# Patient Record
Sex: Male | Born: 1985 | Race: White | Hispanic: No | Marital: Single | Smoking: Current every day smoker
Health system: Southern US, Community
[De-identification: ages and names within clinical notes are randomized; demographics above are authoritative.]

## PROBLEM LIST (undated history)

## (undated) DIAGNOSIS — S62309A Unspecified fracture of unspecified metacarpal bone, initial encounter for closed fracture: Secondary | ICD-10-CM

## (undated) DIAGNOSIS — S20419A Abrasion of unspecified back wall of thorax, initial encounter: Secondary | ICD-10-CM

## (undated) DIAGNOSIS — Z8614 Personal history of Methicillin resistant Staphylococcus aureus infection: Secondary | ICD-10-CM

## (undated) HISTORY — PX: ORIF ORBITAL FRACTURE: SHX5312

---

## 2013-09-17 ENCOUNTER — Encounter (HOSPITAL_COMMUNITY): Payer: Self-pay | Admitting: Emergency Medicine

## 2013-09-17 ENCOUNTER — Emergency Department (HOSPITAL_COMMUNITY)
Admission: EM | Admit: 2013-09-17 | Discharge: 2013-09-17 | Disposition: A | Discharge status: Home / Self Care | Payer: 59 | Attending: Emergency Medicine | Admitting: Emergency Medicine

## 2013-09-17 DIAGNOSIS — K029 Dental caries, unspecified: Secondary | ICD-10-CM

## 2013-09-17 DIAGNOSIS — K006 Disturbances in tooth eruption: Secondary | ICD-10-CM | POA: Insufficient documentation

## 2013-09-17 DIAGNOSIS — F17200 Nicotine dependence, unspecified, uncomplicated: Secondary | ICD-10-CM | POA: Insufficient documentation

## 2013-09-17 MED ORDER — PENICILLIN V POTASSIUM 500 MG PO TABS: 500.0000 mg | ORAL_TABLET | Freq: Three times a day (TID) | ORAL | Status: DC

## 2013-09-17 MED ORDER — OXYCODONE-ACETAMINOPHEN 5-325 MG PO TABS
2.0000 | ORAL_TABLET | Freq: Once | ORAL | Status: AC
  Administered 2013-09-17: 2 via ORAL
  Filled 2013-09-17: qty 2

## 2013-09-17 MED ORDER — OXYCODONE-ACETAMINOPHEN 5-325 MG PO TABS: 1.0000 | ORAL_TABLET | Freq: Four times a day (QID) | ORAL | Status: DC | PRN

## 2013-09-17 NOTE — ED Provider Notes (Signed)
CSN: [id number]     Arrival date & time 09/17/13  2132 History  This chart was scribed for a non-physician practitioner, Dierdre Forth, PA-C, working with Karlene Lineman, MD found by Julian Hy, ED Scribe. The patient was seen in TR10C/[facility]10C. The patient's care was started at 9:58 PM.  Chief Complaint  Patient presents with  . Dental Pain   Patient is a 28 y.o. male presenting with tooth pain. The history is provided by the patient and medical records. No language interpreter was used.  Dental Pain Location:  Upper Quality:  Throbbing Severity:  Moderate Duration:  2 weeks Timing:  Constant Progression:  Worsening Worsened by:  Hot food/drink and cold food/drink Associated symptoms: no drooling, no facial swelling, no fever, no headaches and no neck pain    HPI Comments: Rayaan Garguilo is a 28 y.o. male who presents to the Emergency Department complaining of right and left, upper molar tooth pain onset 2 weeks ago. Pt states his pain is worsened with exposure to hot and cold liquids. Pt describes his pain as throbbing. Pt states he has a dentist appointment in 2 weeks, but his pain is too severe to wait. Pt denies having chills, fever, nausea or vomiting.  History reviewed. No pertinent past medical history. Past Surgical History  Procedure Laterality Date  . Fracture surgery     No family history on file. History  Substance Use Topics  . Smoking status: Current Every Day Smoker  . Smokeless tobacco: Not on file  . Alcohol Use: Yes     Comment: seldom    Review of Systems  Constitutional: Negative for fever, chills and appetite change.  HENT: Positive for dental problem (right upper and left upper ). Negative for drooling, ear pain, facial swelling, nosebleeds, postnasal drip, rhinorrhea and trouble swallowing.   Eyes: Negative for pain and redness.  Respiratory: Negative for cough and wheezing.   Cardiovascular: Negative for chest pain.  Gastrointestinal: Negative  for nausea, vomiting and abdominal pain.  Musculoskeletal: Negative for neck pain and neck stiffness.  Skin: Negative for color change and rash.  Neurological: Negative for weakness, light-headedness and headaches.  All other systems reviewed and are negative.     Allergies  Review of patient's allergies indicates no known allergies.  Home Medications   Prior to Admission medications   Medication Sig Start Date End Date Taking? Authorizing Provider  ibuprofen (ADVIL,MOTRIN) 200 MG tablet Take 200 mg by mouth every 6 (six) hours as needed for moderate pain. For tooth pain   Yes Historical Provider, MD  oxyCODONE-acetaminophen (PERCOCET) 5-325 MG per tablet Take 1-2 tablets by mouth every 6 (six) hours as needed. 09/17/13   Romeo Zielinski, PA-C  penicillin v potassium (VEETID) 500 MG tablet Take 1 tablet (500 mg total) by mouth 3 (three) times daily. 09/17/13   Simra Fiebig, PA-C   Triage Vitals: BP 145/86  Pulse 98  Temp(Src) 98.7 F (37.1 C) (Oral)  Resp 16  Ht 5\' 11"  (1.803 m)  Wt 145 lb (65.772 kg)  BMI 20.23 kg/m2  SpO2 100% Physical Exam  Nursing note and vitals reviewed. Constitutional: He appears well-developed and well-nourished.  HENT:  Head: Normocephalic.  Right Ear: Tympanic membrane, external ear and ear canal normal.  Left Ear: Tympanic membrane, external ear and ear canal normal.  Nose: Nose normal. Right sinus exhibits no maxillary sinus tenderness and no frontal sinus tenderness. Left sinus exhibits no maxillary sinus tenderness and no frontal sinus tenderness.  Mouth/Throat: Uvula is  midline, oropharynx is clear and moist and mucous membranes are normal. No oral lesions. Abnormal dentition. Dental caries present. No uvula swelling or lacerations. No oropharyngeal exudate, posterior oropharyngeal edema, posterior oropharyngeal erythema or tonsillar abscesses.    Dental caries to the left upper and right abnormal areas; no broken teeth  No erythema  or swelling to the gingiva; no gross abscess  Eyes: Conjunctivae are normal. Pupils are equal, round, and reactive to light. Right eye exhibits no discharge. Left eye exhibits no discharge.  Neck: Normal range of motion. Neck supple.  Cardiovascular: Normal rate, regular rhythm and normal heart sounds.   Pulmonary/Chest: Effort normal and breath sounds normal. No respiratory distress. He has no wheezes.  Abdominal: Soft. Bowel sounds are normal. He exhibits no distension. There is no tenderness.  Lymphadenopathy:    He has no cervical adenopathy.  Neurological: He is alert.  Skin: Skin is warm and dry.  Psychiatric: He has a normal mood and affect.    ED Course  Procedures (including critical care time) DIAGNOSTIC STUDIES: Oxygen Saturation is 100% on RA, normal by my interpretation.    COORDINATION OF CARE: 10:11 PM- Patient informed of current plan for treatment and evaluation and agrees with plan at this time.  Labs Review Labs Reviewed - No data to display  Imaging Review No results found.   EKG Interpretation None      MDM   Final diagnoses:  Pain due to dental caries   Natasha BenceShannon Lipps presents with toothache.  No gross abscess.  Exam unconcerning for Ludwig's angina or spread of infection.  Will treat with penicillin and pain medicine.  Urged patient to follow-up with dentist.    I have personally reviewed patient's vitals, nursing note and any pertinent labs or imaging. At this time, it has been determined that no acute conditions requiring further emergency intervention. The patient/guardian have been advised of the diagnosis and plan. I reviewed all labs and imaging including any potential incidental findings. We have discussed signs and symptoms that warrant return to the ED, such as fever, difficulty swallowing, swelling of the face.  Patient/guardian has voiced understanding and agreed to follow-up with the PCP or specialist as soon as possible.  Vital signs are  stable at discharge.   BP 145/86  Pulse 98  Temp(Src) 98.7 F (37.1 C) (Oral)  Resp 16  Ht 5\' 11"  (1.803 m)  Wt 145 lb (65.772 kg)  BMI 20.23 kg/m2  SpO2 100%    I personally performed the services described in this documentation, which was scribed in my presence. The recorded information has been reviewed and is accurate.      Dahlia ClientHannah Emogene Muratalla, PA-C 09/17/13 2211

## 2013-09-17 NOTE — ED Notes (Signed)
Pt c/o dental pain st's having pain in right upper back tooth and left upper back tooth.  St's onset of pain 2 weeks ago.  Pt also st's he has appt with a dentist in 2 weeks but can't wait until then.

## 2013-09-17 NOTE — Discharge Instructions (Signed)
1. Medications: percocet, penicillin, usual home medications 2. Treatment: rest, drink plenty of fluids, take medications as prescribed 3. Follow Up: Please followup with your primary doctor for discussion of your diagnoses and further evaluation after today's visit; if you do not have a primary care doctor use the resource guide provided to find one; f/u with dentistry as discussed    Dental Caries  Dental caries (also called tooth decay) is the most common oral disease. It can occur at any age, but is more common in children and young adults.  HOW DENTAL CARIES DEVELOPS  The process of decay begins when bacteria and foods (particularly sugars and starches) combine in your mouth to produce plaque. Plaque is a substance that sticks to the hard, outer surface of a tooth (enamel). The bacteria in plaque produce acids that attack enamel. These acids may also attack the root surface of a tooth (cementum) if it is exposed. Repeated attacks dissolve these surfaces and create holes in the tooth (cavities). If left untreated, the acids destroy the other layers of the tooth.  RISK FACTORS  Frequent sipping of sugary beverages.   Frequent snacking on sugary and starchy foods, especially those that easily get stuck in the teeth.   Poor oral hygiene.   Dry mouth.   Substance abuse such as methamphetamine abuse.   Broken or poor-fitting dental restorations.   Eating disorders.   Gastroesophageal reflux disease (GERD).   Certain radiation treatments to the head and neck. SYMPTOMS In the early stages of dental caries, symptoms are seldom present. Sometimes white, chalky areas may be seen on the enamel or other tooth layers. In later stages, symptoms may include:  Pits and holes on the enamel.  Toothache after sweet, hot, or cold foods or drinks are consumed.  Pain around the tooth.  Swelling around the tooth. DIAGNOSIS  Most of the time, dental caries is detected during a regular  dental checkup. A diagnosis is made after a thorough medical and dental history is taken and the surfaces of your teeth are checked for signs of dental caries. Sometimes special instruments, such as lasers, are used to check for dental caries. Dental X-ray exams may be taken so that areas not visible to the eye (such as between the contact areas of the teeth) can be checked for cavities.  TREATMENT  If dental caries is in its early stages, it may be reversed with a fluoride treatment or an application of a remineralizing agent at the dental office. Thorough brushing and flossing at home is needed to aid these treatments. If it is in its later stages, treatment depends on the location and extent of tooth destruction:   If a small area of the tooth has been destroyed, the destroyed area will be removed and cavities will be filled with a material such as gold, silver amalgam, or composite resin.   If a large area of the tooth has been destroyed, the destroyed area will be removed and a cap (crown) will be fitted over the remaining tooth structure.   If the center part of the tooth (pulp) is affected, a procedure called a root canal will be needed before a filling or crown can be placed.   If most of the tooth has been destroyed, the tooth may need to be pulled (extracted). HOME CARE INSTRUCTIONS You can prevent, stop, or reverse dental caries at home by practicing good oral hygiene. Good oral hygiene includes:  Thoroughly cleaning your teeth at least twice a day  with a toothbrush and dental floss.   Using a fluoride toothpaste. A fluoride mouth rinse may also be used if recommended by your dentist or health care provider.   Restricting the amount of sugary and starchy foods and sugary liquids you consume.   Avoiding frequent snacking on these foods and sipping of these liquids.   Keeping regular visits with a dentist for checkups and cleanings. PREVENTION   Practice good oral  hygiene.  Consider a dental sealant. A dental sealant is a coating material that is applied by your dentist to the pits and grooves of teeth. The sealant prevents food from being trapped in them. It may protect the teeth for several years.  Ask about fluoride supplements if you live in a community without fluorinated water or with water that has a low fluoride content. Use fluoride supplements as directed by your dentist or health care provider.  Allow fluoride varnish applications to teeth if directed by your dentist or health care provider. Document Released: 11/10/2001 Document Revised: 10/21/2012 Document Reviewed: 02/21/2012 [facility] Patient Information 2015 [place]. This information is not intended to replace advice given to you by your health care provider. Make sure you discuss any questions you have with your health care provider.    Emergency Department Resource Guide 1) Find a Doctor and Pay Out of Pocket Although you won't have to find out who is covered by your insurance plan, it is a good idea to ask around and get recommendations. You will then need to call the office and see if the doctor you have chosen will accept you as a new patient and what types of options they offer for patients who are self-pay. Some doctors offer discounts or will set up payment plans for their patients who do not have insurance, but you will need to ask so you aren't surprised when you get to your appointment.  2) Contact Your Local Health Department Not all health departments have doctors that can see patients for sick visits, but many do, so it is worth a call to see if yours does. If you don't know where your local health department is, you can check in your phone book. The CDC also has a tool to help you locate your state's health department, and many state websites also have listings of all of their local health departments.  3) Find a Walk-in Clinic If your illness is not likely to be very  severe or complicated, you may want to try a walk in clinic. These are popping up all over the country in pharmacies, drugstores, and shopping centers. They're usually staffed by nurse practitioners or physician assistants that have been trained to treat common illnesses and complaints. They're usually fairly quick and inexpensive. However, if you have serious medical issues or chronic medical problems, these are probably not your best option.  No Primary Care Doctor: - Call Health Connect at  [phone] - they can help you locate a primary care doctor that  accepts your insurance, provides certain services, etc. - Physician Referral Service- [phone]  Chronic Pain Problems: Organization         Address  Phone   Notes  [facility]  [phone] Patients need to be referred by their primary care doctor.   Medication Assistance: Organization         Address  Phone   Notes  [facility] [address] [place], [place] [phone] --Must be  a resident of [facility] -- Must have NO insurance coverage whatsoever (no Medicaid/ Medicare, etc.) -- The pt. MUST have a primary care doctor that directs their care regularly and follows them in the community   [facility]  [phone]   [employer]  [phone]    Agencies that provide inexpensive medical care: Organization         Address  Phone   Notes  [facility]  [phone]   [facility]    [phone]   [facility] [address] [place], [place] [address] [phone]   [facility] [address] [place] [place] [phone]   Planned Parenthood    [phone]   [facility]    [phone]   [facility]  [address], [place] Phone:  [phone], Fax:  [phone] Hours of Operation:  9 am - 6 pm, M-F.  Also accepts  Medicaid/Medicare and self-pay.  [facility]  [address], [place] Phone: [phone], Fax: [phone]. Hours of Operation:  8:30 am - 5:30 pm, M-F.  Also accepts Medicaid and self-pay.  [facility] [place] [address] [place] Phone: [phone]   [facility] [address] Natasha Bence [place], [place] [phone], Ext. [phone] Mondays & Thursdays: 7-9 AM.  First 15 patients are seen on a first come, first serve basis.    Medicaid-accepting [facility] Providers:  Organization         Address  Phone   Notes  [facility] [address], [place] [phone] Also accepts self-pay patients.  [facility] [address] Laurell Josephs [place], [place]  [phone]   [facility] [address], [place] [phone]   [facility] [address] [place] [phone]   Renaye Rakers [address], [place]   [phone] Only accepts [place] patients after they have their name applied to their card.   Self-Pay (no insurance) in [facility]:  Organization         Address  Phone   Notes  Sickle Cell Patients, [facility] [address] [place], [place] [phone]   [facility] [address] [place], [place] [phone]   [facility]  [address], [place] [phone]   [facility]/Dr. Osei-Bonsu  [address], [place] or [address], [place] [phone] Phone number for both [place] and [place] locations is the same.  [facility] [address], [place] [phone]   [facility] [address], [place] or [address] [phone]   [facility] [address] [place] [phone], phone; [phone], fax Sees patients 1st and 3rd Saturday of every month.  Must not qualify for public or private insurance (i.e. Medicaid, Medicare, [place] Health Choice, Veterans' Benefits)  Household income should be no more than 200% of the poverty level The clinic cannot treat you if you are pregnant or think you are pregnant  Sexually transmitted diseases are not treated at the clinic.    Dental Care: Organization  Address  Phone  Notes  °[facility] [address], [place] [phone] Accepts children up to age 21 who are enrolled in Medicaid or [place] Health Choice; pregnant women with a Medicaid card; and children who have applied for Medicaid or [place] Health Choice, but were declined, whose parents can pay a reduced fee at time of service.  °[facility] [place]  [address], [place] [phone] Accepts children up to age 21 who are enrolled in Medicaid or [place] Health Choice; pregnant women with a Medicaid card; and children who have applied for Medicaid or [place] Health Choice, but were declined, whose parents can pay a reduced fee at time of service.  °[facility] ° [address], [place] [phone] Patients are seen by appointment only. Walk-ins are not accepted. [facility] will see patients 18 years of age and older. °Monday - Tuesday (8am-5pm) °Most Wednesdays (8:30-5pm) °$30 per visit, cash only  °[facility] ° [address], [place] [phone] Patients are seen by appointment only. Walk-ins are not accepted. [facility] will see patients 18 years of age and older. °One Wednesday Evening (Monthly: Volunteer Based).  $30 per visit, cash only  °[facility]  [phone] for adults; Children under age 4, call Graduate Pediatric Dentistry at [phone]. Children aged  4-14, please call [phone] to request a pediatric application. ° Dental services are provided in all areas of dental care including fillings, crowns and bridges, complete and partial dentures, implants, gum treatment, root canals, and extractions. Preventive care is also provided. Treatment is provided to both adults and children. °Patients are selected via a lottery and there is often a waiting list. °  °[facility] [address], °[place] ° ([phone] [email or web address] °  °[facility] [address], [place], [place] [phone], Ext. [phone] Second and Fourth Thursday of each month, opens at 6:30 AM; Clinic ends at 9 AM.  Patients are seen on a first-come first-served basis, and a limited number are seen during each clinic.  ° °[facility] ° [address], [place], [place] [phone]   Eligibility Requirements °You must have lived in [place], [place], or [place] counties for at least the last three months. °  You cannot be eligible for state or federal sponsored healthcare insurance, including Veterans Administration, Medicaid, or Medicare. °  You generally cannot be eligible for healthcare insurance through your employer.  °  How to apply: °Eligibility screenings are held every Tuesday and Wednesday afternoon from 1:00 pm until 4:00 pm. You do not need an appointment for the interview!  °[facility] [address], [place], [place] [phone]   °[facility]  [phone]   °[facility]  [phone]   °[facility]  [phone]   ° °Behavioral Health Resources in the Community: °Intensive Outpatient Programs °Organization         Address  Phone  Notes  °[facility] [address], [place], [place] [phone]   °[facility] [address], [place], [place] [phone]   °[facility] [address],  [place], [place] ° [phone]   °[facility] [address],  °[place], [place] 1-800-853-5163 or [phone]   °Substance Abuse Resources °  Organization         Address  Phone  Notes  [facility]  [phone]   [facility]  [phone]   [facility]  [phone]   Floydene Flock  [phone]   Residential & Outpatient Substance Abuse Program  [phone]   Psychological Services Organization         Address  Phone  Notes  [facility]  [phone]   [facility]  [phone]   [facility] [address] [place] [phone] or [phone]    Mobile Crisis Teams Organization         Address  Phone  Notes  [facility], Mobile Crisis Care Unit  ([phone]   [facility]  [address] [place], [place] [phone]   Doristine Locks [address] [place] [phone]    Self-Help/Support Groups Organization         Address  Phone             Notes  [facility] [place] - variety of support groups  336- I7437963 Call for more information  Narcotics Anonymous (NA), [facility] [address], [employer] [place]  2 meetings at this location   Statistician         Address  Phone  Notes  [facility] [address],    [place]  [phone]   [facility]  [address], [place] [address], [place], [place] [phone]   [facility] [address] [place], [place] [phone] Admissions: 8am-3pm M-F  [facility] [address],    [place], [place] [phone]   [facility] [address] [place], [place], [place] [phone]   [facility] [address],  [place], [place] [phone]   [facility] - Intensive Outpatient [address] Laurell Josephs [address], [place], [place] [phone]   [facility]  ([facility] [address] [place].,  [place], [place] [phone] or [phone]   [facility] ([facility]) [address], [place], [place] [phone] Accepts Medicaid  [facility] [place] [address]  [place] [phone] Substance Abuse/Addiction Treatment   [facility]         Address  Phone  Notes  [facility]  [phone]   Angie Fava, PhD [address] Ervin Knack [place], [place]   (279) [phone] or [phone]   [facility]   [address] [place], [place] [phone]   [facility] [address], [place], [place] [phone] Insurance/Medicaid/sponsorship through [facility] [address]                                    [place], [place] [phone] Therapy/tele-psych/case  [facility] [address], [place] [phone]    Dr. Lolly Mustache  [phone]   [facility]  [employer] [facility] Dept. 1) [address] [place] 2) [address], [place] 3)  [address], [place] [phone]  [phone]   [facility] Child Abuse Hotline (863) 365-9947 or [phone] (After Hours)

## 2013-09-18 NOTE — ED Provider Notes (Signed)
Medical screening examination/treatment/procedure(s) were performed by non-physician practitioner and as supervising physician I was immediately available for consultation/collaboration.  Shweta Aman L Dicky Boer, MD 09/18/13 0119 

## 2013-09-28 ENCOUNTER — Encounter (HOSPITAL_COMMUNITY): Payer: Self-pay | Admitting: Emergency Medicine

## 2013-09-28 ENCOUNTER — Emergency Department (HOSPITAL_COMMUNITY)
Admission: EM | Admit: 2013-09-28 | Discharge: 2013-09-28 | Disposition: A | Discharge status: Home / Self Care | Payer: 59 | Attending: Emergency Medicine | Admitting: Emergency Medicine

## 2013-09-28 DIAGNOSIS — F17200 Nicotine dependence, unspecified, uncomplicated: Secondary | ICD-10-CM | POA: Diagnosis not present

## 2013-09-28 DIAGNOSIS — K029 Dental caries, unspecified: Secondary | ICD-10-CM | POA: Diagnosis not present

## 2013-09-28 DIAGNOSIS — K089 Disorder of teeth and supporting structures, unspecified: Secondary | ICD-10-CM | POA: Insufficient documentation

## 2013-09-28 DIAGNOSIS — K006 Disturbances in tooth eruption: Secondary | ICD-10-CM | POA: Insufficient documentation

## 2013-09-28 DIAGNOSIS — L259 Unspecified contact dermatitis, unspecified cause: Secondary | ICD-10-CM

## 2013-09-28 MED ORDER — DEXAMETHASONE SODIUM PHOSPHATE 10 MG/ML IJ SOLN: 10.0000 mg | Freq: Once | INTRAMUSCULAR | Status: DC

## 2013-09-28 MED ORDER — PREDNISONE 10 MG PO TABS: 40.0000 mg | ORAL_TABLET | Freq: Every day | ORAL | Status: DC

## 2013-09-28 MED ORDER — HYDROCODONE-ACETAMINOPHEN 5-325 MG PO TABS: 1.0000 | ORAL_TABLET | ORAL | Status: DC | PRN

## 2013-09-28 MED ORDER — OXYCODONE-ACETAMINOPHEN 5-325 MG PO TABS
1.0000 | ORAL_TABLET | Freq: Once | ORAL | Status: AC
  Administered 2013-09-28: 1 via ORAL
  Filled 2013-09-28: qty 1

## 2013-09-28 MED ORDER — HYDROXYZINE HCL 10 MG PO TABS
10.0000 mg | ORAL_TABLET | Freq: Once | ORAL | Status: AC
  Administered 2013-09-28: 10 mg via ORAL
  Filled 2013-09-28: qty 1

## 2013-09-28 MED ORDER — TRIAMCINOLONE ACETONIDE 0.5 % EX OINT: 1.0000 "application " | TOPICAL_OINTMENT | Freq: Two times a day (BID) | CUTANEOUS | Status: DC

## 2013-09-28 MED ORDER — PREDNISONE 20 MG PO TABS: ORAL_TABLET | ORAL | Status: DC

## 2013-09-28 MED ORDER — METHYLPREDNISOLONE SODIUM SUCC 125 MG IJ SOLR
125.0000 mg | Freq: Once | INTRAMUSCULAR | Status: AC
  Administered 2013-09-28: 125 mg via INTRAMUSCULAR
  Filled 2013-09-28: qty 2

## 2013-09-28 NOTE — ED Provider Notes (Signed)
CSN: [id number]     Arrival date & time 09/28/13  1821 History   None    This chart was scribed for non-physician practitioner, Dierdre Forth PA-C working with Ward Givens, MD by Arlan Organ, ED Scribe. This patient was seen in room TR06C/TR06C and the patient's care was started at 7:06 PM.   Chief Complaint  Patient presents with  . Dental Pain  . Rash   Patient is a 28 y.o. male presenting with tooth pain and rash. The history is provided by the patient and medical records. No language interpreter was used.  Dental Pain Location:  Upper Upper teeth location:  3/RU 1st molar and 16/LU 3rd molar Severity:  Moderate Onset quality:  Gradual Duration:  1 month Timing:  Constant Progression:  Worsening Relieved by:  Nothing Ineffective treatments:  Acetaminophen Associated symptoms: no fever, no headaches and no oral lesions   Rash Location:  Shoulder/arm Shoulder/arm rash location:  L forearm and R forearm Quality: itchiness   Severity:  Moderate Onset quality:  Gradual Duration:  1 week Timing:  Constant Progression:  Unchanged Chronicity:  New Context: plant contact   Relieved by:  Nothing Associated symptoms: no abdominal pain, no diarrhea, no fatigue, no fever, no headaches, no nausea, no shortness of breath, not vomiting and not wheezing     HPI Comments: Resean Brander is a 28 y.o. male who presents to the Emergency Department complaining of constant, moderate dental pain x 1 month that has progressively worsened. He also reports an associated HA. Pt states he has tried several different OTC medications without any improvement for discomfort. At this time denies any fever, ear pain, or chills. No changes in vision. He plans to follow up with his dentist in about 1 week.  Pt mentions a rash to the forearms bilaterally onset 1 week. 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 He reports contact with posion  ivy.  He has tried Calamine lotion without any improvement for symptoms. No known allergies to medications. No other concerns this visit.  History reviewed. No pertinent past medical history. Past Surgical History  Procedure Laterality Date  . Fracture surgery     History reviewed. No pertinent family history. History  Substance Use Topics  . Smoking status: Current Every Day Smoker  . Smokeless tobacco: Not on file  . Alcohol Use: Yes     Comment: seldom    Review of Systems  Constitutional: Negative for fever, chills, diaphoresis, appetite change, fatigue and unexpected weight change.  HENT: Positive for dental problem. Negative for mouth sores and trouble swallowing.   Eyes: Negative for visual disturbance.  Respiratory: Negative for cough, chest tightness, shortness of breath and wheezing.   Cardiovascular: Negative for chest pain.  Gastrointestinal: Negative for nausea, vomiting, abdominal pain, diarrhea and constipation.  Endocrine: Negative for polydipsia, polyphagia and polyuria.  Genitourinary: Negative for dysuria, urgency, frequency and hematuria.  Musculoskeletal: Negative for back pain and neck stiffness.  Skin: Positive for rash.  Allergic/Immunologic: Negative for immunocompromised state.  Neurological: Negative for syncope, light-headedness and headaches.  Hematological: Does not bruise/bleed easily.  Psychiatric/Behavioral: Negative for sleep disturbance. The patient is not nervous/anxious.       Allergies  Review of patient's allergies indicates no known allergies.  Home Medications   Prior to Admission medications   Medication Sig Start Date End Date Taking? Authorizing Provider  ibuprofen (ADVIL,MOTRIN) 200 MG tablet Take 200 mg by  mouth every 6 (six) hours as needed for moderate pain. For tooth pain   Yes Historical Provider, MD  HYDROcodone-acetaminophen (NORCO/VICODIN) 5-325 MG per tablet Take 1 tablet by mouth every 4 (four) hours as needed for  moderate pain or severe pain. 09/28/13   Cataleia Gade, PA-C  predniSONE (DELTASONE) 10 MG tablet Take 4 tablets (40 mg total) by mouth daily. 09/28/13   Destony Prevost, PA-C  triamcinolone ointment (KENALOG) 0.5 % Apply 1 application topically 2 (two) times daily. 09/28/13   Megon Kalina, PA-C   Triage Vitals: BP 138/84  Pulse 73  Temp(Src) 99 F (37.2 C)  Resp 18  SpO2 99%   Physical Exam  Nursing note and vitals reviewed. Constitutional: He appears well-developed and well-nourished.  HENT:  Head: Normocephalic.  Right Ear: Tympanic membrane, external ear and ear canal normal.  Left Ear: Tympanic membrane, external ear and ear canal normal.  Nose: Nose normal. Right sinus exhibits no maxillary sinus tenderness and no frontal sinus tenderness. Left sinus exhibits no maxillary sinus tenderness and no frontal sinus tenderness.  Mouth/Throat: Uvula is midline, oropharynx is clear and moist and mucous membranes are normal. No oral lesions. Abnormal dentition. Dental caries present. No uvula swelling or lacerations. No oropharyngeal exudate, posterior oropharyngeal edema, posterior oropharyngeal erythema or tonsillar abscesses.    Significant dental caries noted to tooth #3 and tooth #16 No erythema or swelling of the gingiva, no gross abscess  Eyes: Conjunctivae are normal. Pupils are equal, round, and reactive to light. Right eye exhibits no discharge. Left eye exhibits no discharge.  Neck: Normal range of motion. Neck supple.  Cardiovascular: Normal rate, regular rhythm and normal heart sounds.   Pulmonary/Chest: Effort normal and breath sounds normal. No respiratory distress. He has no wheezes.  Abdominal: Soft. Bowel sounds are normal. He exhibits no distension. There is no tenderness.  Lymphadenopathy:    He has no cervical adenopathy.  Neurological: He is alert.  Skin: Skin is warm and dry.  Scattered erythematous rash with papules along the bilateral forearms,  abdomen with associated excoriations but no induration, evidence of abscess or secondary infection; no weeping noted  Psychiatric: He has a normal mood and affect.    ED Course  Procedures (including critical care time)  DIAGNOSTIC STUDIES: Oxygen Saturation is 99% on RA, Normal by my interpretation.    COORDINATION OF CARE:    Labs Review Labs Reviewed - No data to display  Imaging Review No results found.   EKG Interpretation None      MDM   Final diagnoses:  Pain due to dental caries  Contact dermatitis   Natasha BenceShannon Wronski presents with persistent dental pain and rash consistent with contact dermatitis from poison ivy.    Patient with toothache.  No gross abscess.  Exam unconcerning for Ludwig's angina or spread of infection.  Will treat with pain medicine.  Urged patient to follow-up with dentist.    Pt presentation consistant with poison ivy infection. Discussed contagiousness & home care. Treated in ED w atarax. DC with recommendation to get OTC Zanfel. Script for triamcinolone given along with  prednisone taper. Strict return precautions discussed. Pt afebrile and in NAD prior to dc. Airway intact without compromise. No facial or genital involvement of rash.  Patient is to followup with his primary care physician in 3 days for further evaluation. He is to return to the emergency room for fevers, worsening rash or involvement of the face or genitals.  BP 138/84  Pulse 73  Temp(Src) 99 F (37.2 C)  Resp 18  SpO2 99%  I personally performed the services described in this documentation, which was scribed in my presence. The recorded information has been reviewed and is accurate.    Dahlia Client Annsleigh Dragoo, PA-C 09/28/13 1941

## 2013-09-28 NOTE — ED Provider Notes (Signed)
Medical screening examination/treatment/procedure(s) were performed by non-physician practitioner and as supervising physician I was immediately available for consultation/collaboration.   EKG Interpretation None      Devoria AlbeIva Londyn Hotard, MD, Armando GangFACEP   Ward GivensIva L Iverna Hammac, MD 09/28/13 2001

## 2013-09-28 NOTE — ED Notes (Signed)
Pt repoirts teeth pain for one month. Pt also has a rash on bil forearm.

## 2013-09-28 NOTE — Discharge Instructions (Signed)
1. Medications: Prednisone, Benadryl, hydrocortisone lotion, Vicodin foe pain, usual home medications 2. Treatment: rest, drink plenty of fluids, take medications as prescribed 3. Follow Up: Please followup with your primary doctor for discussion of your diagnoses and further evaluation after today's visit; if you do not have a primary care doctor use the resource guide provided to find one; followup with dermatology as needed  Poison [facility] ivy is a inflammation of the skin (contact dermatitis) caused by touching the allergens on the leaves of the ivy plant following previous exposure to the plant. The rash usually appears 48 hours after exposure. The rash is usually bumps (papules) or blisters (vesicles) in a linear pattern. Depending on your own sensitivity, the rash may simply cause redness and itching, or it may also progress to blisters which may break open. These must be well cared for to prevent secondary bacterial (germ) infection, followed by scarring. Keep any open areas dry, clean, dressed, and covered with an antibacterial ointment if needed. The eyes may also get puffy. The puffiness is worst in the morning and gets better as the day progresses. This dermatitis usually heals without scarring, within 2 to 3 weeks without treatment. HOME CARE INSTRUCTIONS  Thoroughly wash with soap and water as soon as you have been exposed to poison ivy. You have about one half hour to remove the plant resin before it will cause the rash. This washing will destroy the oil or antigen on the skin that is causing, or will cause, the rash. Be sure to wash under your fingernails as any plant resin there will continue to spread the rash. Do not rub skin vigorously when washing affected area. Poison ivy cannot spread if no oil from the plant remains on your body. A rash that has progressed to weeping sores will not spread the rash unless you have not washed thoroughly. It is also important to wash any clothes you  have been wearing as these may carry active allergens. The rash will return if you wear the unwashed clothing, even several days later. Avoidance of the plant in the future is the best measure. Poison ivy plant can be recognized by the number of leaves. Generally, poison ivy has three leaves with flowering branches on a single stem. Diphenhydramine may be purchased over the counter and used as needed for itching. Do not drive with this medication if it makes you drowsy.Ask your caregiver about medication for children. SEEK MEDICAL CARE IF:  Open sores develop.  Redness spreads beyond area of rash.  You notice purulent (pus-like) discharge.  You have increased pain.  Other signs of infection develop (such as fever). Document Released: 02/16/2000 Document Revised: 05/13/2011 Document Reviewed: 07/29/2008 [facility] Patient Information 2015 [place], [place]. This information is not intended to replace advice given to you by your health care provider. Make sure you discuss any questions you have with your health care provider.  Dental Pain A tooth ache may be caused by cavities (tooth decay). Cavities expose the nerve of the tooth to air and hot or cold temperatures. It may come from an infection or abscess (also called a boil or furuncle) around your tooth. It is also often caused by dental caries (tooth decay). This causes the pain you are having. DIAGNOSIS  Your caregiver can diagnose this problem by exam. TREATMENT   If caused by an infection, it may be treated with medications which kill germs (antibiotics) and pain medications as prescribed by your caregiver. Take medications as directed.  Only  take over-the-counter or prescription medicines for pain, discomfort, or fever as directed by your caregiver.  Whether the tooth ache today is caused by infection or dental disease, you should see your dentist as soon as possible for further care. SEEK MEDICAL CARE IF: The exam and treatment you  received today has been provided on an emergency basis only. This is not a substitute for complete medical or dental care. If your problem worsens or new problems (symptoms) appear, and you are unable to meet with your dentist, call or return to this location. SEEK IMMEDIATE MEDICAL CARE IF:   You have a fever.  You develop redness and swelling of your face, jaw, or neck.  You are unable to open your mouth.  You have severe pain uncontrolled by pain medicine. MAKE SURE YOU:   Understand these instructions.  Will watch your condition.  Will get help right away if you are not doing well or get worse. Document Released: 02/18/2005 Document Revised: 05/13/2011 Document Reviewed: 10/07/2007 [facility] Patient Information 2015 [place]. This information is not intended to replace advice given to you by your health care provider. Make sure you discuss any questions you have with your health care provider.

## 2013-09-28 NOTE — ED Notes (Signed)
Declined W/C at D/C and was escorted to lobby by RN. 

## 2013-12-23 ENCOUNTER — Emergency Department (HOSPITAL_COMMUNITY)
Admission: EM | Admit: 2013-12-23 | Discharge: 2013-12-23 | Disposition: A | Discharge status: Home / Self Care | Payer: 59 | Attending: Emergency Medicine | Admitting: Emergency Medicine

## 2013-12-23 ENCOUNTER — Encounter (HOSPITAL_COMMUNITY): Payer: Self-pay | Admitting: Emergency Medicine

## 2013-12-23 DIAGNOSIS — Z720 Tobacco use: Secondary | ICD-10-CM | POA: Insufficient documentation

## 2013-12-23 DIAGNOSIS — K0889 Other specified disorders of teeth and supporting structures: Secondary | ICD-10-CM

## 2013-12-23 DIAGNOSIS — Z7952 Long term (current) use of systemic steroids: Secondary | ICD-10-CM | POA: Insufficient documentation

## 2013-12-23 DIAGNOSIS — R3915 Urgency of urination: Secondary | ICD-10-CM

## 2013-12-23 DIAGNOSIS — M545 Low back pain, unspecified: Secondary | ICD-10-CM

## 2013-12-23 DIAGNOSIS — K088 Other specified disorders of teeth and supporting structures: Secondary | ICD-10-CM | POA: Insufficient documentation

## 2013-12-23 LAB — URINALYSIS, ROUTINE W REFLEX MICROSCOPIC
BILIRUBIN URINE: NEGATIVE
GLUCOSE, UA: NEGATIVE mg/dL
Hgb urine dipstick: NEGATIVE
Ketones, ur: NEGATIVE mg/dL
Leukocytes, UA: NEGATIVE
Nitrite: NEGATIVE
PH: 5.5 (ref 5.0–8.0)
Protein, ur: NEGATIVE mg/dL
SPECIFIC GRAVITY, URINE: 1.026 (ref 1.005–1.030)
Urobilinogen, UA: 1 mg/dL (ref 0.0–1.0)

## 2013-12-23 MED ORDER — TRAMADOL HCL 50 MG PO TABS: 50.0000 mg | ORAL_TABLET | Freq: Four times a day (QID) | ORAL | Status: DC | PRN

## 2013-12-23 MED ORDER — PENICILLIN V POTASSIUM 500 MG PO TABS: 500.0000 mg | ORAL_TABLET | Freq: Four times a day (QID) | ORAL | Status: AC

## 2013-12-23 NOTE — ED Provider Notes (Signed)
CSN: [id number]     Arrival date & time 12/23/13  1708 History   First MD Initiated Contact with Patient 12/23/13 1806     This chart was scribed for non-physician practitioner, Santiago GladHeather Karlena Luebke PA-C,  working with Gwyneth SproutWhitney Plunkett, MD by Arlan OrganAshley Leger, ED Scribe. This patient was seen in room WTR8/WTR8 and the patient's care was started at 6:08 PM.   Chief Complaint  Patient presents with  . Dental Pain   The history is provided by the patient. No language interpreter was used.    HPI Comments: Phillip Moody is a 28 y.o. male who presents to the Emergency Department complaining of constant, moderate upper dental pain x 1 month that has progressively worsened in last week. He has tried OTC Ibuprofren and Aleve without any imporvement for symptoms. Pt denies any trouble swallowing of swelling of the face. No fever, chills, or other associated symptoms. Pt has a scheduled appointment to see his dentist in 3 weeks. Mr. Phillip Moody also mentions that he thinks that he may have a urinary tract infection. He reports pressure when urinating, urinary urgency, and a strong odor to his urine. No penile discharge or dysuria.  No scrotal pain or swelling.    Pt reports new, constant, moderate back pain that has been ongoing for 1 month now. Pain does not radiate. He admits to heavy lifting while at work. However, he denies any recent injury or trauma. No numbness, loss of sensation, or paresthesia. No urinary or bowel incontinence. No known allergies to medications.   History reviewed. No pertinent past medical history. Past Surgical History  Procedure Laterality Date  . Fracture surgery     No family history on file. History  Substance Use Topics  . Smoking status: Current Every Day Smoker  . Smokeless tobacco: Not on file  . Alcohol Use: Yes     Comment: seldom    Review of Systems  Constitutional: Negative for fever and chills.  HENT: Positive for dental problem. Negative for trouble swallowing.    Genitourinary: Positive for urgency. Negative for dysuria and discharge.  Musculoskeletal: Positive for back pain.      Allergies  Review of patient's allergies indicates no known allergies.  Home Medications   Prior to Admission medications   Medication Sig Start Date End Date Taking? Authorizing Provider  HYDROcodone-acetaminophen (NORCO/VICODIN) 5-325 MG per tablet Take 1 tablet by mouth every 4 (four) hours as needed for moderate pain or severe pain. 09/28/13   Hannah Muthersbaugh, PA-C  ibuprofen (ADVIL,MOTRIN) 200 MG tablet Take 200 mg by mouth every 6 (six) hours as needed for moderate pain. For tooth pain    Historical Provider, MD  predniSONE (DELTASONE) 20 MG tablet 3 tabs po daily x 3 days, then 2 tabs x 3 days, then 1.5 tabs x 3 days, then 1 tab x 3 days, then 0.5 tabs x 3 days 09/28/13   Dahlia ClientHannah Muthersbaugh, PA-C  triamcinolone ointment (KENALOG) 0.5 % Apply 1 application topically 2 (two) times daily. 09/28/13   Hannah Muthersbaugh, PA-C   Triage Vitals: BP 136/78  Pulse 79  Temp(Src) 98.5 F (36.9 C) (Oral)  Resp 18  SpO2 100%   Physical Exam  Nursing note and vitals reviewed. Constitutional: He appears well-developed and well-nourished. No distress.  HENT:  Head: Normocephalic and atraumatic.  Mouth/Throat: No trismus in the jaw. No dental abscesses.  Tenderness over L upper gingiva and R upper ginigiva No sublingual tenderness or swelling No submandibular or cervical lymphadenopathy   Neck:  Neck supple.  Cardiovascular: Normal rate, regular rhythm and normal heart sounds.   Pulmonary/Chest: Effort normal and breath sounds normal.  Musculoskeletal: Normal range of motion.       Lumbar back: He exhibits normal range of motion, no tenderness, no bony tenderness, no swelling, no edema and no deformity.  No lumbar or thoracic tenderness No stepoffs or deformity noted  Neurological: He is alert. He has normal strength. No sensory deficit. Gait normal.  Reflex  Scores:      Patellar reflexes are 2+ on the right side and 2+ on the left side. Strength is 5/5 to lower extremities bilaterally Patient ambulating without difficulty  Skin: He is not diaphoretic.    ED Course  Procedures (including critical care time)  DIAGNOSTIC STUDIES: Oxygen Saturation is 100% on RA, Normal by my interpretation.    COORDINATION OF CARE: 6:08 PM- Will order urinalysis. Discussed treatment plan with pt at bedside and pt agreed to plan.     Labs Review Labs Reviewed - No data to display  Imaging Review No results found.   EKG Interpretation None      MDM   Final diagnoses:  None   Patient with toothache.  No gross abscess.  Exam unconcerning for Ludwig's angina or spread of infection.  Will treat with penicillin and pain medicine.  Urged patient to follow-up with dentist.  Patient also complaining of urinary symptoms.  UA negative.  GC/Chlamydia pending.  Patient also complaining of back pain.  No neurological deficits and normal neuro exam.  Patient can walk but states is painful.  No loss of bowel or bladder control.  No concern for cauda equina.  No fever, night sweats, weight loss, h/o cancer, IVDU.  RICE protocol and pain medicine indicated and discussed with patient.   Patient stable for discharge.  Return precautions given.     I personally performed the services described in this documentation, which was scribed in my presence. The recorded information has been reviewed and is accurate.    Santiago GladHeather Breniyah Romm, PA-C 12/24/13 [phone]
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
## 2013-12-24 LAB — GC/CHLAMYDIA PROBE AMP
CT Probe RNA: NEGATIVE
GC PROBE AMP APTIMA: NEGATIVE

## 2013-12-26 NOTE — ED Provider Notes (Signed)
Medical screening examination/treatment/procedure(s) were performed by non-physician practitioner and as supervising physician I was immediately available for consultation/collaboration.   EKG Interpretation None        Gwyneth SproutWhitney Oddis Westling, MD 12/26/13 [phone]

## 2014-04-21 ENCOUNTER — Encounter (HOSPITAL_COMMUNITY): Payer: Self-pay | Admitting: Emergency Medicine

## 2014-04-21 ENCOUNTER — Emergency Department (HOSPITAL_COMMUNITY)
Admission: EM | Admit: 2014-04-21 | Discharge: 2014-04-21 | Disposition: A | Discharge status: Home / Self Care | Payer: Self-pay | Attending: Emergency Medicine | Admitting: Emergency Medicine

## 2014-04-21 DIAGNOSIS — Y9389 Activity, other specified: Secondary | ICD-10-CM | POA: Insufficient documentation

## 2014-04-21 DIAGNOSIS — Y9289 Other specified places as the place of occurrence of the external cause: Secondary | ICD-10-CM | POA: Insufficient documentation

## 2014-04-21 DIAGNOSIS — K029 Dental caries, unspecified: Secondary | ICD-10-CM | POA: Insufficient documentation

## 2014-04-21 DIAGNOSIS — Y998 Other external cause status: Secondary | ICD-10-CM | POA: Insufficient documentation

## 2014-04-21 DIAGNOSIS — Z720 Tobacco use: Secondary | ICD-10-CM | POA: Insufficient documentation

## 2014-04-21 DIAGNOSIS — S39012A Strain of muscle, fascia and tendon of lower back, initial encounter: Secondary | ICD-10-CM | POA: Insufficient documentation

## 2014-04-21 DIAGNOSIS — X58XXXA Exposure to other specified factors, initial encounter: Secondary | ICD-10-CM | POA: Insufficient documentation

## 2014-04-21 DIAGNOSIS — K0889 Other specified disorders of teeth and supporting structures: Secondary | ICD-10-CM

## 2014-04-21 DIAGNOSIS — K429 Umbilical hernia without obstruction or gangrene: Secondary | ICD-10-CM | POA: Insufficient documentation

## 2014-04-21 DIAGNOSIS — Z7952 Long term (current) use of systemic steroids: Secondary | ICD-10-CM | POA: Insufficient documentation

## 2014-04-21 DIAGNOSIS — K088 Other specified disorders of teeth and supporting structures: Secondary | ICD-10-CM | POA: Insufficient documentation

## 2014-04-21 MED ORDER — IBUPROFEN 800 MG PO TABS: 800.0000 mg | ORAL_TABLET | Freq: Three times a day (TID) | ORAL | Status: DC | PRN

## 2014-04-21 MED ORDER — IBUPROFEN 400 MG PO TABS
600.0000 mg | ORAL_TABLET | Freq: Once | ORAL | Status: AC
  Administered 2014-04-21: 600 mg via ORAL
  Filled 2014-04-21 (×2): qty 1

## 2014-04-21 MED ORDER — HYDROCODONE-ACETAMINOPHEN 5-325 MG PO TABS: 1.0000 | ORAL_TABLET | Freq: Four times a day (QID) | ORAL | Status: DC | PRN

## 2014-04-21 MED ORDER — ERYTHROMYCIN 5 MG/GM OP OINT: TOPICAL_OINTMENT | OPHTHALMIC | Status: DC

## 2014-04-21 MED ORDER — HYDROCODONE-ACETAMINOPHEN 5-325 MG PO TABS
1.0000 | ORAL_TABLET | Freq: Once | ORAL | Status: AC
  Administered 2014-04-21: 1 via ORAL
  Filled 2014-04-21: qty 1

## 2014-04-21 NOTE — Discharge Instructions (Signed)
Return here as needed.  Follow-up with the resources provided. °

## 2014-04-21 NOTE — ED Notes (Signed)
Pt. presents with multiple complaints : Stye at right lower eyelid with redness/pain , left and right upper molar pain for for 3 months ; lower back pain and increasing size of umbilical hernia for the last several weeks .

## 2014-04-21 NOTE — ED Provider Notes (Signed)
CSN: [id number]     Arrival date & time 04/21/14  2015 History   First MD Initiated Contact with Patient 04/21/14 2233     No chief complaint on file.    (Consider location/radiation/quality/duration/timing/severity/associated sxs/prior Treatment) HPI Patient presents to the emergency department with multiple complaints.  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  The patient has not taken any medications prior to arrival.  The patient states that he does not have a dentist and does not have a primary care doctor.  The patient denies chest pain, shortness of breath, nausea, vomiting, weakness, dizziness, headache, blurred vision, fever, cough, or syncope Past Medical History  Diagnosis Date  . Umbilical hernia    Past Surgical History  Procedure Laterality Date  . Fracture surgery     No family history on file. History  Substance Use Topics  . Smoking status: Current Every Day Smoker  . Smokeless tobacco: Not on file  . Alcohol Use: Yes     Comment: seldom    Review of Systems  All other systems negative except as documented in the HPI. All pertinent positives and negatives as reviewed in the HPI.   Allergies  Review of patient's allergies indicates no known allergies.  Home Medications   Prior to Admission medications   Medication Sig Start Date End Date Taking? Authorizing Provider  acetaminophen (TYLENOL) 500 MG tablet Take 500 mg by mouth every 6 (six) hours as needed for mild pain.   Yes Historical Provider, MD  ibuprofen (ADVIL,MOTRIN) 200 MG tablet Take 200 mg by mouth every 6 (six) hours as needed for moderate pain. For tooth pain   Yes Historical Provider, MD  HYDROcodone-acetaminophen (NORCO/VICODIN) 5-325 MG per tablet Take 1 tablet by mouth every 4 (four) hours as needed for moderate pain or severe  pain. Patient not taking: Reported on 04/21/2014 09/28/13   Dahlia Client Muthersbaugh, PA-C  predniSONE (DELTASONE) 20 MG tablet 3 tabs po daily x 3 days, then 2 tabs x 3 days, then 1.5 tabs x 3 days, then 1 tab x 3 days, then 0.5 tabs x 3 days Patient not taking: Reported on 04/21/2014 09/28/13   Dahlia Client Muthersbaugh, PA-C  traMADol (ULTRAM) 50 MG tablet Take 1 tablet (50 mg total) by mouth every 6 (six) hours as needed. Patient not taking: Reported on 04/21/2014 12/23/13   Santiago Glad, PA-C  triamcinolone ointment (KENALOG) 0.5 % Apply 1 application topically 2 (two) times daily. Patient not taking: Reported on 04/21/2014 09/28/13   Dahlia Client Muthersbaugh, PA-C   BP 169/95 mmHg  Pulse 72  Temp(Src) 97.3 F (36.3 C) (Oral)  Resp 18  Ht  (1.778 m)  Wt 150 lb (68.04 kg)  BMI 21.52 kg/m2  SpO2 100% Physical Exam  Constitutional: He appears well-developed and well-nourished. No distress.  HENT:  Head: Normocephalic and atraumatic.  Mouth/Throat: Oropharynx is clear and moist and mucous membranes are normal. No trismus in the jaw. Dental caries present. No uvula swelling.  Eyes: Pupils are equal, round, and reactive to light.  Neck: Normal range of motion. Neck supple.  Cardiovascular: Normal rate, regular rhythm and normal heart sounds.  Exam reveals no gallop and no friction rub.   No murmur heard. Pulmonary/Chest: Effort normal. No respiratory distress.  Abdominal:    Skin: Skin is warm and dry.  Nursing note  and vitals reviewed.   ED Course  Procedures (including critical care time) Labs Review Labs Reviewed - No data to display  The case manager will give the patient resources for follow-up.  Told to return here as needed.  These issues are all chronic in nature and there is no nuchal, getting factors  Carlyle DollyChristopher W Donnalee Cellucci, PA-C 04/21/14 [id number]  Arby BarretteMarcy Pfeiffer, MD 04/21/14 [phone]

## 2014-05-03 DIAGNOSIS — Z8614 Personal history of Methicillin resistant Staphylococcus aureus infection: Secondary | ICD-10-CM

## 2014-05-03 HISTORY — DX: Personal history of Methicillin resistant Staphylococcus aureus infection: Z86.14

## 2014-05-03 HISTORY — PX: IRRIGATION AND DEBRIDEMENT ABSCESS: SHX5252

## 2014-05-20 ENCOUNTER — Encounter (HOSPITAL_COMMUNITY): Payer: Self-pay | Admitting: Emergency Medicine

## 2014-05-20 ENCOUNTER — Emergency Department (HOSPITAL_COMMUNITY)
Admission: EM | Admit: 2014-05-20 | Discharge: 2014-05-20 | Disposition: A | Discharge status: Home / Self Care | Payer: Self-pay | Attending: Emergency Medicine | Admitting: Emergency Medicine

## 2014-05-20 DIAGNOSIS — Z792 Long term (current) use of antibiotics: Secondary | ICD-10-CM | POA: Insufficient documentation

## 2014-05-20 DIAGNOSIS — K088 Other specified disorders of teeth and supporting structures: Secondary | ICD-10-CM | POA: Insufficient documentation

## 2014-05-20 DIAGNOSIS — Z79899 Other long term (current) drug therapy: Secondary | ICD-10-CM | POA: Insufficient documentation

## 2014-05-20 DIAGNOSIS — Z7952 Long term (current) use of systemic steroids: Secondary | ICD-10-CM | POA: Insufficient documentation

## 2014-05-20 DIAGNOSIS — K0889 Other specified disorders of teeth and supporting structures: Secondary | ICD-10-CM

## 2014-05-20 DIAGNOSIS — Z8719 Personal history of other diseases of the digestive system: Secondary | ICD-10-CM | POA: Insufficient documentation

## 2014-05-20 DIAGNOSIS — Z720 Tobacco use: Secondary | ICD-10-CM | POA: Insufficient documentation

## 2014-05-20 MED ORDER — HYDROCODONE-ACETAMINOPHEN 5-325 MG PO TABS
1.0000 | ORAL_TABLET | Freq: Once | ORAL | Status: AC
Start: 2014-05-20 — End: 2014-05-20
  Administered 2014-05-20: 1 via ORAL
  Filled 2014-05-20: qty 1

## 2014-05-20 MED ORDER — HYDROCODONE-ACETAMINOPHEN 5-325 MG PO TABS
1.0000 | ORAL_TABLET | ORAL | Status: DC | PRN
Start: 2014-05-20 — End: 2014-06-13

## 2014-05-20 NOTE — ED Provider Notes (Signed)
CSN: [id number]     Arrival date & time 05/20/14  1909 History  This chart was scribed for Elpidio AnisShari Karas Pickerill, PA-C, working with Eber HongBrian Miller, MD by Elon SpannerGarrett Cook, ED Scribe. This patient was seen in room WTR6/WTR6 and the patient's care was started at 8:27 PM.   Chief Complaint  Patient presents with  . Dental Pain   The history is provided by the patient. No language interpreter was used.   HPI Comments: Phillip Moody is a 29 y.o. male with a history of chronic dental conditions who presents to the Emergency Department complaining of left upper dental pain.  He reports hot/cold foods/liquids cause shooting pain.  Patient has taken 2 extra strength Tylenol at 4:00 PM0 for pain.  Patient denies seeing a dentist due to lack of insurance.  Patient denies fever, facial swelling.    Past Medical History  Diagnosis Date  . Umbilical hernia    Past Surgical History  Procedure Laterality Date  . Fracture surgery     History reviewed. No pertinent family history. History  Substance Use Topics  . Smoking status: Current Every Day Smoker  . Smokeless tobacco: Not on file  . Alcohol Use: Yes     Comment: seldom    Review of Systems  Constitutional: Negative for fever.  HENT: Positive for dental problem. Negative for facial swelling and trouble swallowing.   Gastrointestinal: Negative for nausea.  Musculoskeletal: Negative for neck stiffness.      Allergies  Review of patient's allergies indicates no known allergies.  Home Medications   Prior to Admission medications   Medication Sig Start Date End Date Taking? Authorizing Provider  acetaminophen (TYLENOL) 500 MG tablet Take 1,000 mg by mouth every 6 (six) hours as needed for moderate pain (tooth pain).    Yes Historical Provider, MD  traMADol (ULTRAM) 50 MG tablet Take 1 tablet (50 mg total) by mouth every 6 (six) hours as needed. 12/23/13  Yes Heather Laisure, PA-C  erythromycin ophthalmic ointment Place a 1/2 inch ribbon of ointment  into the lower eyelid. Patient not taking: Reported on 05/20/2014 04/21/14   Charlestine Nighthristopher Lawyer, PA-C  HYDROcodone-acetaminophen (NORCO/VICODIN) 5-325 MG per tablet Take 1 tablet by mouth every 6 (six) hours as needed for moderate pain. Patient not taking: Reported on 05/20/2014 04/21/14   Charlestine Nighthristopher Lawyer, PA-C  ibuprofen (ADVIL,MOTRIN) 800 MG tablet Take 1 tablet (800 mg total) by mouth every 8 (eight) hours as needed. Patient not taking: Reported on 05/20/2014 04/21/14   Charlestine Nighthristopher Lawyer, PA-C  predniSONE (DELTASONE) 20 MG tablet 3 tabs po daily x 3 days, then 2 tabs x 3 days, then 1.5 tabs x 3 days, then 1 tab x 3 days, then 0.5 tabs x 3 days Patient not taking: Reported on 04/21/2014 09/28/13   Dahlia ClientHannah Muthersbaugh, PA-C  triamcinolone ointment (KENALOG) 0.5 % Apply 1 application topically 2 (two) times daily. Patient not taking: Reported on 04/21/2014 09/28/13   Dahlia ClientHannah Muthersbaugh, PA-C   BP 151/104 mmHg  Pulse 88  Temp(Src) 98.3 F (36.8 C) (Oral)  Resp 17  SpO2 99% Physical Exam  Constitutional: He is oriented to person, place, and time. He appears well-developed and well-nourished. No distress.  HENT:  Head: Normocephalic and atraumatic.  Generally good dentition. Moderate to severe decay of #19, otherwise, no widespread decay.  Wisdom teeth absent. No facial swelling.   Eyes: Conjunctivae and EOM are normal.  Neck: Neck supple. No tracheal deviation present.  Cardiovascular: Normal rate.   Pulmonary/Chest: Effort normal. No respiratory  distress.  Musculoskeletal: Normal range of motion.  Lymphadenopathy:    He has no cervical adenopathy.  Neurological: He is alert and oriented to person, place, and time.  Skin: Skin is warm and dry.  Psychiatric: He has a normal mood and affect. His behavior is normal.  Nursing note and vitals reviewed.   ED Course  Procedures (including critical care time)  DIAGNOSTIC STUDIES: Oxygen Saturation is 99% on RA, normal by my interpretation.     COORDINATION OF CARE:  8:29 PM Discussed treatment plan with patient at bedside.  Patient acknowledges and agrees with plan.    Labs Review Labs Reviewed - No data to display  Imaging Review No results found.   EKG Interpretation None      MDM   Final diagnoses:  None    1. Dental pain  Cover with abx. Chart reviewed without repeated opiate Rx's. Treat pain and offer dental referral.  I personally performed the services described in this documentation, which was scribed in my presence. The recorded information has been reviewed and is accurate.     Elpidio Anis, PA-C 05/22/14 2002  Eber Hong, MD 05/23/14 [phone]

## 2014-05-20 NOTE — ED Notes (Signed)
Patient comes in complaining of right and left upper dental pain.  Patient believes he chipped something off.  Took approximately 2 tylenol in the last 6 hours.  He has not sought dental care. "Anything hot or cold causes shooting pain".

## 2014-05-20 NOTE — Discharge Instructions (Signed)
Dental Pain °A tooth ache may be caused by cavities (tooth decay). Cavities expose the nerve of the tooth to air and hot or cold temperatures. It may come from an infection or abscess (also called a boil or furuncle) around your tooth. It is also often caused by dental caries (tooth decay). This causes the pain you are having. °DIAGNOSIS  °Your caregiver can diagnose this problem by exam. °TREATMENT  °· If caused by an infection, it may be treated with medications which kill germs (antibiotics) and pain medications as prescribed by your caregiver. Take medications as directed. °· Only take over-the-counter or prescription medicines for pain, discomfort, or fever as directed by your caregiver. °· Whether the tooth ache today is caused by infection or dental disease, you should see your dentist as soon as possible for further care. °SEEK MEDICAL CARE IF: °The exam and treatment you received today has been provided on an emergency basis only. This is not a substitute for complete medical or dental care. If your problem worsens or new problems (symptoms) appear, and you are unable to meet with your dentist, call or return to this location. °SEEK IMMEDIATE MEDICAL CARE IF:  °· You have a fever. °· You develop redness and swelling of your face, jaw, or neck. °· You are unable to open your mouth. °· You have severe pain uncontrolled by pain medicine. °MAKE SURE YOU:  °· Understand these instructions. °· Will watch your condition. °· Will get help right away if you are not doing well or get worse. °Document Released: 02/18/2005 Document Revised: 05/13/2011 Document Reviewed: 10/07/2007 °ExitCare® Patient Information ©2015 [employer]. This information is not intended to replace advice given to you by your health care provider. Make sure you discuss any questions you have with your health care provider. ° °Emergency Department Resource Guide °1) Find a Doctor and Pay Out of Pocket °Although you won't have to find out who  is covered by your insurance plan, it is a good idea to ask around and get recommendations. You will then need to call the office and see if the doctor you have chosen will accept you as a new patient and what types of options they offer for patients who are self-pay. Some doctors offer discounts or will set up payment plans for their patients who do not have insurance, but you will need to ask so you aren't surprised when you get to your appointment. ° °2) Contact Your Local Health Department °Not all health departments have doctors that can see patients for sick visits, but many do, so it is worth a call to see if yours does. If you don't know where your local health department is, you can check in your phone book. The CDC also has a tool to help you locate your state's health department, and many state websites also have listings of all of their local health departments. ° °3) Find a Walk-in Clinic °If your illness is not likely to be very severe or complicated, you may want to try a walk in clinic. These are popping up all over the country in pharmacies, drugstores, and shopping centers. They're usually staffed by nurse practitioners or physician assistants that have been trained to treat common illnesses and complaints. They're usually fairly quick and inexpensive. However, if you have serious medical issues or chronic medical problems, these are probably not your best option. ° °No Primary Care Doctor: °- Call [facility] at  832-8000 - they can help you locate a primary   care doctor that  accepts your insurance, provides certain services, etc. °- Physician Referral Service- 1-800-533-3463 ° °Chronic Pain Problems: °Organization         Address  Phone   Notes  °[facility]  ([phone]) [phone] Patients need to be referred by their primary care doctor.  ° °Medication Assistance: °Organization         Address  Phone   Notes  °[place] [facility] [address]  [address] °[place], [place] [address] (336) 641-8030 --Must be a resident of [place] °-- Must have NO insurance coverage whatsoever (no Medicaid/ Medicare, etc.) °-- The pt. MUST have a primary care doctor that directs their care regularly and follows them in the community °  °[facility]  (866) 331-1348   °[employer]  (888) 892-1162   ° °Agencies that provide inexpensive medical care: °Organization         Address  Phone   Notes  °[facility]  ([phone]) [phone]   °[facility]    (336) 832-7272   °[facility] [address] °[place], [place] [address] (336) 832-4777   °[facility] [address], °[place] (336) [phone]   °[facility]    (336) 373-0678   °[facility]    (336) 272-1050   °[facility] ° [address], [place] Phone:  (336) 832-4444, Fax:  (336) 832-4440 Hours of Operation:  9 am - 6 pm, M-F.  Also accepts Medicaid/Medicare and self-pay.  °[facility] ° [address], [place] Phone: (336) [phone], Fax: ([phone]) [phone]. Hours of Operation:  8:30 am - 5:30 pm, M-F.  Also accepts Medicaid and self-pay.  °[facility] [place] [address], [place] Phone: (336) [phone]   °[facility] [address], [place], [place] (336)723-1848, Ext. 123 Mondays & Thursdays: 7-9 AM.  First 15 patients are seen on a first come, first serve basis. °  ° °Medicaid-accepting [place] Providers: ° °Organization         Address  Phone   Notes  °[facility] [address], [place] ([phone]) [phone] Also accepts self-pay patients.  °[facility] [address], [place] ° (336) [phone]   °[facility] [address], [place] (336) [phone]   °[facility] [address] [place] [address], [place] (336) [phone]   °[facility] [address], [place]  ° (336) [phone] Only accepts [place] Access Medicaid patients after they have their name applied to their card.  ° °Self-Pay (no insurance) in [place]: ° °Organization         Address  Phone   Notes  °Sickle Cell Patients, [facility] [address], [place] (336) [phone]   °[facility] [address], [place] (336) [phone]   °[facility] ° [address], [place] (336) [phone]   °[facility]/Dr. Osei-Bonsu ° [address] [place] [address], [place] or [address], [place] ([phone]) [phone] Phone number for both [place] and [place] locations is the same.  °[facility] [address], [place] ([phone]) [phone]   °[facility] [address] [place] [address],  or [address] (336) 852-7530 °(336) [phone]   °  [facility] [address], [place] (336) 350-1642, phone; (336) [phone], fax Sees patients 1st and 3rd Saturday of every month.  Must not qualify for public or private insurance (i.e. Medicaid, Medicare, [place] Health Choice, Veterans' Benefits) • Household income should be no more than 200% of the poverty level •The clinic cannot treat you if you are pregnant or think you are pregnant • Sexually transmitted diseases are not treated at the clinic.  ° ° °Dental Care: °Organization         Address  Phone  Notes  °[facility] [address], [place] (336) [phone] Accepts children up to age 21 who are enrolled in Medicaid or [place] Health Choice; pregnant women with a Medicaid card; and children who have applied for Medicaid or [place] Health Choice, but were declined, whose parents can pay a reduced fee at time of service.  °[facility] [place]  [address], [place] (336) 641-7733 Accepts children up to age 21 who are  enrolled in Medicaid or [place] Health Choice; pregnant women with a Medicaid card; and children who have applied for Medicaid or [place] Health Choice, but were declined, whose parents can pay a reduced fee at time of service.  °[facility] ° [address], [place] (336) [phone] Patients are seen by appointment only. Walk-ins are not accepted. [facility] will see patients 18 years of age and older. °Monday - Tuesday (8am-5pm) °Most Wednesdays (8:30-5pm) °$30 per visit, cash only  °[facility] ° [address], [place] ([phone]) [phone] Patients are seen by appointment only. Walk-ins are not accepted. [facility] will see patients 18 years of age and older. °One Wednesday Evening (Monthly: Volunteer Based).  $30 per visit, cash only  °[facility]  ([phone]) [phone] for adults; Children under age 4, call Graduate Pediatric Dentistry at ([phone]) [phone]. Children aged 4-14, please call ([phone]) [phone] to request a pediatric application. ° Dental services are provided in all areas of dental care including fillings, crowns and bridges, complete and partial dentures, implants, gum treatment, root canals, and extractions. Preventive care is also provided. Treatment is provided to both adults and children. °Patients are selected via a lottery and there is often a waiting list. °  °[facility] [address], °[place] ° (336) 763-8833 [email or web address] °  °[facility] [address], [place], [place] (336)723-1848, Ext. 123 Second and Fourth Thursday of each month, opens at 6:30 AM; Clinic ends at 9 AM.  Patients are seen on a first-come first-served basis, and a limited number are seen during each clinic.  ° °[facility] ° [address], [place], [place] ([phone]) [phone]   Eligibility Requirements °You must have lived in [place], [place], or [place] counties for at least the last three months. °  You  cannot be eligible for state or federal sponsored healthcare insurance, including Veterans Administration, Medicaid, or Medicare. °  You generally cannot be eligible for healthcare insurance through your employer.  °  How to apply: °Eligibility screenings are held every Tuesday and Wednesday afternoon from 1:00 pm until 4:00 pm. You do not need an appointment for the interview!  °[facility] [address], [place],  [phone]   °[facility]  [phone]   °[facility]  [phone]   °[facility]  [phone]   °  °

## 2014-06-13 ENCOUNTER — Encounter (HOSPITAL_COMMUNITY): Payer: Self-pay | Admitting: Emergency Medicine

## 2014-06-13 ENCOUNTER — Emergency Department (HOSPITAL_COMMUNITY): Payer: Self-pay

## 2014-06-13 ENCOUNTER — Emergency Department (HOSPITAL_COMMUNITY)
Admission: EM | Admit: 2014-06-13 | Discharge: 2014-06-13 | Disposition: A | Discharge status: Home / Self Care | Payer: Self-pay | Attending: Emergency Medicine | Admitting: Emergency Medicine

## 2014-06-13 DIAGNOSIS — Z720 Tobacco use: Secondary | ICD-10-CM | POA: Insufficient documentation

## 2014-06-13 DIAGNOSIS — Y929 Unspecified place or not applicable: Secondary | ICD-10-CM | POA: Insufficient documentation

## 2014-06-13 DIAGNOSIS — S63616A Unspecified sprain of right little finger, initial encounter: Secondary | ICD-10-CM | POA: Insufficient documentation

## 2014-06-13 DIAGNOSIS — Y999 Unspecified external cause status: Secondary | ICD-10-CM | POA: Insufficient documentation

## 2014-06-13 DIAGNOSIS — Z8719 Personal history of other diseases of the digestive system: Secondary | ICD-10-CM | POA: Insufficient documentation

## 2014-06-13 DIAGNOSIS — S63619A Unspecified sprain of unspecified finger, initial encounter: Secondary | ICD-10-CM

## 2014-06-13 DIAGNOSIS — W01198A Fall on same level from slipping, tripping and stumbling with subsequent striking against other object, initial encounter: Secondary | ICD-10-CM | POA: Insufficient documentation

## 2014-06-13 DIAGNOSIS — Y939 Activity, unspecified: Secondary | ICD-10-CM | POA: Insufficient documentation

## 2014-06-13 DIAGNOSIS — S63614A Unspecified sprain of right ring finger, initial encounter: Secondary | ICD-10-CM | POA: Insufficient documentation

## 2014-06-13 MED ORDER — METHOCARBAMOL 500 MG PO TABS: 500.0000 mg | ORAL_TABLET | Freq: Four times a day (QID) | ORAL | Status: DC | PRN

## 2014-06-13 MED ORDER — METHOCARBAMOL 500 MG PO TABS
1000.0000 mg | ORAL_TABLET | Freq: Once | ORAL | Status: AC
  Administered 2014-06-13: 1000 mg via ORAL
  Filled 2014-06-13: qty 2

## 2014-06-13 MED ORDER — MELOXICAM 7.5 MG PO TABS: 7.5000 mg | ORAL_TABLET | Freq: Every day | ORAL | Status: DC

## 2014-06-13 NOTE — ED Notes (Signed)
Pt c/o R hand pain. Reports pt tripped and fell; pts pinky finger hyperextended. redness and swelling noted with limited ROM.

## 2014-06-13 NOTE — ED Provider Notes (Signed)
CSN: [id number]     Arrival date & time 06/13/14  2008 History   First MD Initiated Contact with Patient 06/13/14 2028     Chief Complaint  Patient presents with  . 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  It is swollen and red and he says his pain is 10/10.     (Consider location/radiation/quality/duration/timing/severity/associated sxs/prior Treatment) HPI   Patient presents with pain over the base of his 4th and 5th fingers, palmar aspect, that began yesterday after falling on outstretched hand.  States he tripped and fell forward onto his right hand, bending his fingers backwards.  Denies weakness or numbness of the fingers, denies other injury.  Has been taking ibuprofen for pain without relief.   Past Medical History  Diagnosis Date  . Umbilical hernia    Past Surgical History  Procedure Laterality Date  . Fracture surgery     History reviewed. No pertinent family history. History  Substance Use Topics  . Smoking status: Current Every Day Smoker  . Smokeless tobacco: Not on file  . Alcohol Use: Yes     Comment: seldom    Review of Systems  Constitutional: Negative for fever.  Musculoskeletal: Positive for arthralgias.  Skin: Negative for color change, pallor and wound.  Allergic/Immunologic: Negative for immunocompromised state.  Neurological: Negative for weakness and numbness.  Psychiatric/Behavioral: Negative for self-injury (accidental).      Allergies  Review of patient's allergies indicates no known allergies.  Home Medications   Prior to Admission medications   Medication Sig Start Date End Date Taking? Authorizing Provider  acetaminophen (TYLENOL) 500 MG tablet Take 1,000 mg by mouth every 6 (six) hours as needed for moderate pain (tooth pain).    Yes Historical Provider, MD  erythromycin ophthalmic ointment Place a 1/2 inch ribbon of ointment into the lower eyelid. Patient not taking: Reported on  05/20/2014 04/21/14   Charlestine Night, PA-C  HYDROcodone-acetaminophen (NORCO/VICODIN) 5-325 MG per tablet Take 1-2 tablets by mouth every 4 (four) hours as needed. Patient not taking: Reported on 06/13/2014 05/20/14   Elpidio Anis, PA-C  ibuprofen (ADVIL,MOTRIN) 800 MG tablet Take 1 tablet (800 mg total) by mouth every 8 (eight) hours as needed. Patient not taking: Reported on 05/20/2014 04/21/14   Charlestine Night, PA-C  predniSONE (DELTASONE) 20 MG tablet 3 tabs po daily x 3 days, then 2 tabs x 3 days, then 1.5 tabs x 3 days, then 1 tab x 3 days, then 0.5 tabs x 3 days Patient not taking: Reported on 04/21/2014 09/28/13   Dahlia Client Muthersbaugh, PA-C  traMADol (ULTRAM) 50 MG tablet Take 1 tablet (50 mg total) by mouth every 6 (six) hours as needed. Patient not taking: Reported on 06/13/2014 12/23/13   Santiago Glad, PA-C  triamcinolone ointment (KENALOG) 0.5 % Apply 1 application topically 2 (two) times daily. Patient not taking: Reported on 04/21/2014 09/28/13   Dahlia Client Muthersbaugh, PA-C   BP 174/85 mmHg  Pulse 107  Temp(Src) 98.5 F (36.9 C)  Resp 20  Wt 151 lb 1 oz (68.522 kg)  SpO2 100% Physical Exam  Constitutional: He appears well-developed and well-nourished. No distress.  HENT:  Head: Normocephalic and atraumatic.  Neck: Neck supple.  Pulmonary/Chest: Effort normal.  Musculoskeletal: He exhibits no edema.  Right hand with tenderness over palmar aspect, 4th and 5th MCPs.  Distal sensation intact.  No other bony tenderness.    Neurological: He is alert. He exhibits normal  muscle tone.  Skin: He is not diaphoretic.  Nursing note and vitals reviewed.   ED Course  Procedures (including critical care time) Labs Review Labs Reviewed - No data to display  Imaging Review Dg Hand Complete Right  06/13/2014   CLINICAL DATA:  Right hand pain after injury. Dropped a log on right hand, bent hand backwards, now having right hand pain over the fifth metacarpal phalangeal joint. 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 Redness and swelling to fifth digit.  EXAM: RIGHT HAND - COMPLETE 3+ VIEW  COMPARISON:  None.  FINDINGS: The fifth digit is held in flexion at the proximal interphalangeal joint on all views. Allowing for this, no fracture or dislocation. No erosion or periosteal reaction. There is soft tissue edema about the fifth digit, no radiopaque foreign body.  IMPRESSION: 1. No fracture or dislocation. 2. Fifth digit held in flexion at the proximal interphalangeal joint on all views, this may be related to soft tissue injury versus positioning. 3. Soft tissue edema about the fifth digit.   Electronically Signed   By: Rubye OaksMelanie  Ehinger M.D.   On: 06/13/2014 21:24     EKG Interpretation None      MDM   Final diagnoses:  Finger sprain, initial encounter    Afebrile, nontoxic patient with injury to his right hand while FOOSH/mechanical fall.   Xray negative.  Likely sprained.  Neurovascularly intact.   D/C home with ACE wrap, RICE instructions, mobic, robaxin, PCP resources for follow up.   Discussed result, findings, treatment, and follow up  with patient.  Pt given return precautions.  Pt verbalizes understanding and agrees with plan.        Trixie Dredgemily Nature Kueker, PA-C 06/13/14 2136  Mancel BaleElliott Wentz, MD 06/14/14 [phone]

## 2014-06-13 NOTE — Discharge Instructions (Signed)
Read the information below.  Use the prescribed medication as directed.  Please discuss all new medications with your pharmacist.  You may return to the Emergency Department at any time for worsening condition or any new symptoms that concern you.  If you develop uncontrolled pain, weakness or numbness of the extremity, severe discoloration of the skin, or you are unable to move your fingers, return to the ER for a recheck.      Finger Sprain A finger sprain happens when the bands of tissue that hold the finger bones together (ligaments) stretch too much and tear. HOME CARE  Keep your injured finger raised (elevated) when possible.  Put ice on the injured area, twice a day, for 2 to 3 days.  Put ice in a plastic bag.  Place a towel between your skin and the bag.  Leave the ice on for 15 minutes.  Only take medicine as told by your doctor.  Do not wear rings on the injured finger.  Protect your finger until pain and stiffness go away (usually 3 to 4 weeks).  Do not get your cast or splint to get wet. Cover your cast or splint with a plastic bag when you shower or bathe. Do not swim.  Your doctor may suggest special exercises for you to do. These exercises will help keep or stop stiffness from happening. GET HELP RIGHT AWAY IF:  Your cast or splint gets damaged.  Your pain gets worse, not better. MAKE SURE YOU:  Understand these instructions.  Will watch your condition.  Will get help right away if you are not doing well or get worse. Document Released: 03/23/2010 Document Revised: 05/13/2011 Document Reviewed: 10/22/2010 [facility] Patient Information 2015 [place], [place]. This information is not intended to replace advice given to you by your health care provider. Make sure you discuss any questions you have with your health care provider.    Emergency Department Resource Guide 1) Find a Doctor and Pay Out of Pocket Although you won't have to find out who is covered by your  insurance plan, it is a good idea to ask around and get recommendations. You will then need to call the office and see if the doctor you have chosen will accept you as a new patient and what types of options they offer for patients who are self-pay. Some doctors offer discounts or will set up payment plans for their patients who do not have insurance, but you will need to ask so you aren't surprised when you get to your appointment.  2) Contact Your Local Health Department Not all health departments have doctors that can see patients for sick visits, but many do, so it is worth a call to see if yours does. If you don't know where your local health department is, you can check in your phone book. The CDC also has a tool to help you locate your state's health department, and many state websites also have listings of all of their local health departments.  3) Find a Walk-in Clinic If your illness is not likely to be very severe or complicated, you may want to try a walk in clinic. These are popping up all over the country in pharmacies, drugstores, and shopping centers. They're usually staffed by nurse practitioners or physician assistants that have been trained to treat common illnesses and complaints. They're usually fairly quick and inexpensive. However, if you have serious medical issues or chronic medical problems, these are probably not your best option.  No  Primary Care Doctor: - Call Health Connect at  [phone] - they can help you locate a primary care doctor that  accepts your insurance, provides certain services, etc. - Physician Referral Service- [phone]  Chronic Pain Problems: Organization         Address  Phone   Notes  [facility]  [phone] Patients need to be referred by their primary care doctor.   Medication Assistance: Organization         Address  Phone   Notes  [facility] Medication [facility] [address] [place], [place] [address] [phone] --Must be a resident of [facility] -- Must have NO insurance coverage whatsoever (no Medicaid/ Medicare, etc.) -- The pt. MUST have a primary care doctor that directs their care regularly and follows them in the community   [facility]  [phone]   [employer]  [phone]    Agencies that provide inexpensive medical care: Organization         Address  Phone   Notes  [facility]  [phone]   [facility]    [phone]   [facility] [address] [place], [place] [address] [phone]   [facility] [address] [place], [place] [phone]   Planned Parenthood    [phone]   [facility]    [phone]   [facility]  [address], [place] Phone:  [phone], Fax:  [phone] Hours of Operation:  9 am - 6 pm, M-F.  Also accepts Medicaid/Medicare and self-pay.  [facility]  [address], [place] Phone: [phone], Fax: [phone]. Hours of Operation:  8:30 am - 5:30 pm, M-F.  Also accepts Medicaid and self-pay.  [facility] [place] [address], [place] Phone: [phone]   [facility] [address] Natasha Bence [place], [place] [phone], Ext. [phone] Mondays & Thursdays: 7-9 AM.  First 15 patients are seen on a first come, first serve basis.    Medicaid-accepting [facility] Providers:  Organization         Address  Phone   Notes  [facility] [address], [place] [phone] Also accepts self-pay patients.  [facility] [address] Laurell Josephs [place], [place]  [phone]   [facility] [address], [place] [phone]   [facility] [address], [place] [phone]   Renaye Rakers [address], [place]   [phone] Only accepts [place] patients after they have their name applied to their card.   Self-Pay (no insurance) in [facility]:  Organization         Address  Phone   Notes  Sickle Cell Patients, [facility] [address] [place], [place] [phone]   [facility] [address] [place], [place] [phone]   [facility]  [address], [place] [phone]   [facility]/Dr. Osei-Bonsu  [address] [place] or [address], [place] [phone] Phone number for both [place] and [place] locations is the same.  [facility] [address] Ginette Otto [phone]  [facility] [address] [place] or [address] [phone]   [facility] [address] [place], [place] [phone], phone; [phone], fax Sees patients 1st and 3rd Saturday of every month.  Must not qualify for public or private insurance (i.e. Medicaid, Medicare, [place] Health Choice, Veterans' Benefits)  Household income should be no more than 200% of the poverty level The clinic cannot treat you if you are pregnant or think you are pregnant  Sexually transmitted diseases are not treated at the clinic.    Dental Care: Organization         Address  Phone  Notes  [facility] Department of [facility] [address] [place], [place] [phone] Accepts children up to age 66 who are enrolled in [place] or [place] Health Choice; pregnant women with a Medicaid card; and children who have applied for Medicaid or [place] Health Choice, but were declined, whose parents can pay a reduced fee at time of service.  [facility] Department of [facility]  [address], [place] [phone] Accepts children up to age 103 who are enrolled  in [place] or [place] Health Choice; pregnant women with a Medicaid card; and children who have applied for Medicaid or [place] Health Choice, but were declined, whose parents can pay a reduced fee at time of service.  [facility]  [address] [place], [place] [phone] Patients are seen by appointment only. Walk-ins are not accepted. [facility] will see patients 68 years of age and older. Monday - Tuesday (8am-5pm) Most Wednesdays (8:30-5pm) $30 per visit, cash only  [facility]  [address], [facility] [phone] Patients are seen by appointment only. Walk-ins are not accepted. [facility] will see patients 47 years of age and older. One Wednesday Evening (Monthly: Volunteer Based).  $30 per visit, cash only  [employer]  [phone] for adults; Children under age 60, call Graduate Pediatric Dentistry at [phone]. Children aged 33-14, please call [phone] to request a pediatric application.  Dental services are provided in all areas of dental care including fillings, crowns and bridges, complete and partial dentures, implants, gum treatment, root canals, and extractions. Preventive care is also provided. Treatment is provided to both adults and children. Patients are selected via a lottery and there is often a waiting list.   [facility] [address], [place]  [phone] [email or web address]   [facility] [address] [place], [place] [phone], Ext. [phone] Second and Fourth Thursday of each month, opens at 6:30 AM; Clinic ends at 9 AM.  Patients are seen on a first-come first-served basis, and a limited number are seen during each clinic.   [facility]  [address] Ether Griffins [place], [place] [phone]   Eligibility Requirements You must have lived in [place], [place], or [place] counties for at least the last three months.   You cannot be  eligible for state or federal sponsored [place], including [employer], [place], or [employer].   You generally cannot be eligible for healthcare insurance through your employer.    How to apply: Eligibility screenings are held every Tuesday and Wednesday afternoon from 1:00 pm until 4:00 pm. You do not need an appointment for the interview!  [facility] [address], [place], [place] [phone]  [facility]  [phone]   [facility]  [phone]   [facility]  [phone]    Behavioral Health Resources in the Community: Intensive Outpatient Programs Organization         Address  Phone  Notes  [facility] [address], [place], [place] [phone]   [facility] Out[facility] [address], [place], [place] [phone]   [facility] [address], [place], [place]  [phone]   [facility] [address],  [place], [place] [phone] or [phone]   Substance Abuse Resources Organization         Address  Phone  Notes  [facility]  [phone]   [facility]  [phone]   [facility]  [phone]   [facility]  [phone]   Residential & Outpatient Substance Abuse Program  [phone]   Psychological Services Organization         Address  Phone  Notes  [facility]  [phone]   [facility]  [phone]- [phone]   [facility] [address], [place] [phone] or [phone]    Mobile Crisis Teams Organization         Address  Phone  Notes  [facility], Mobile Crisis Care Unit  [phone]   [facility]  [address] [place], [place] [phone]   Doristine LocksSharon DeEsch [address] [place] [phone]    Self-Help/Support Groups Organization          Address  Phone             Notes  [facility] [place] - variety of support groups  [phone] Call for more information  Narcotics Anonymous (NA), [facility] [address], [place]  2 meetings at this location   Statisticianesidential Treatment Programs Organization         Address  Phone  Notes  [facility] [address],    [place]  [phone]   [facility]  [address], [place] [phone], [place], [place] [phone]   [facility] [address], [place] [phone] Admissions: 8am-3pm M-F  [facility] [address]    [place], [place] [phone]   [facility] [address], [place], [place] [phone]   [facility] [address]  [place], [place] [phone]   [facility] - Intensive Outpatient [address], [place], [place] [phone]   [facility] ([facility] [address]  [place], [place] [phone] or [phone]   [facility] ([facility]) [address] [place], [place] [phone] Accepts Medicaid  [facility] [address]  [place] [phone] Substance Abuse/Addiction Treatment   [facility] Resources Organization         Address  Phone  Notes  [facility]  [phone]   Angie FavaJulie Brannon, PhD [address] [place], [place]   [phone]) [phone] or [phone]   [facility]   [address] [place], [place] [phone]   [facility] [address], [place], [place] [phone] Insurance/Medicaid/sponsorship through [facility]  [facility] [address]  [place], [place] [phone] [place], [place] [phone]    Dr. Adele Schilder  [phone]   [facility] Dept. 1) [address] [place] 2) [place] 3)  [address], [place] [phone]  [phone]   [place] [phone] or [phone] (After Hours)

## 2014-06-13 NOTE — ED Notes (Signed)
Pt in radiology; will transport to exam room after imaging

## 2014-06-13 NOTE — ED Notes (Signed)
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  It is swollen and red and he says his pain is 10/10.

## 2014-06-16 ENCOUNTER — Emergency Department (HOSPITAL_COMMUNITY)
Admission: EM | Admit: 2014-06-16 | Discharge: 2014-06-16 | Disposition: A | Discharge status: Home / Self Care | Payer: Self-pay | Attending: Emergency Medicine | Admitting: Emergency Medicine

## 2014-06-16 ENCOUNTER — Encounter (HOSPITAL_COMMUNITY): Payer: Self-pay

## 2014-06-16 DIAGNOSIS — Z8719 Personal history of other diseases of the digestive system: Secondary | ICD-10-CM | POA: Insufficient documentation

## 2014-06-16 DIAGNOSIS — Z7952 Long term (current) use of systemic steroids: Secondary | ICD-10-CM | POA: Insufficient documentation

## 2014-06-16 DIAGNOSIS — Z720 Tobacco use: Secondary | ICD-10-CM | POA: Insufficient documentation

## 2014-06-16 DIAGNOSIS — Z791 Long term (current) use of non-steroidal anti-inflammatories (NSAID): Secondary | ICD-10-CM | POA: Insufficient documentation

## 2014-06-16 DIAGNOSIS — S60221D Contusion of right hand, subsequent encounter: Secondary | ICD-10-CM | POA: Insufficient documentation

## 2014-06-16 DIAGNOSIS — W14XXXD Fall from tree, subsequent encounter: Secondary | ICD-10-CM | POA: Insufficient documentation

## 2014-06-16 MED ORDER — OXYCODONE-ACETAMINOPHEN 5-325 MG PO TABS: 1.0000 | ORAL_TABLET | Freq: Four times a day (QID) | ORAL | Status: DC | PRN

## 2014-06-16 MED ORDER — OXYCODONE-ACETAMINOPHEN 5-325 MG PO TABS
2.0000 | ORAL_TABLET | Freq: Once | ORAL | Status: AC
  Administered 2014-06-16: 2 via ORAL
  Filled 2014-06-16: qty 2

## 2014-06-16 NOTE — ED Notes (Signed)
Pt. Given ice pack. 

## 2014-06-16 NOTE — ED Notes (Signed)
Pt injured his hand Monday at work and [facility], he states they told him it wasn't broken and gave him pain meds and muscle relaxers, he states the meds aren't working and the swelling hasn't decreased.

## 2014-06-16 NOTE — ED Provider Notes (Signed)
CSN: [id number]     Arrival date & time 06/16/14  0117 History   First MD Initiated Contact with Patient 06/16/14 0158     Chief Complaint  Patient presents with  . Hand Pain     (Consider location/radiation/quality/duration/timing/severity/associated sxs/prior Treatment) HPI Comments: Patient injured his hand Monday at work falling out of a tree.  He was seen at that time.  X-ray showed no broken bones.  No dislocations or subluxations, and was discharged him with ibuprofen.  Return to the emergency room today with significant increase in swelling and pain despite the use of ibuprofen and ice.   Patient is a 29 y.o. male presenting with hand pain. The history is provided by the patient.  Hand Pain This is a recurrent problem. The current episode started in the past 7 days. The problem has been gradually worsening. Pertinent negatives include no fever, numbness or rash. The symptoms are aggravated by exertion. He has tried rest and NSAIDs for the symptoms. The treatment provided no relief.    Past Medical History  Diagnosis Date  . Umbilical hernia    Past Surgical History  Procedure Laterality Date  . Fracture surgery     History reviewed. No pertinent family history. History  Substance Use Topics  . Smoking status: Current Every Day Smoker  . Smokeless tobacco: Not on file  . Alcohol Use: Yes     Comment: seldom    Review of Systems  Constitutional: Negative for fever.  Skin: Negative for rash and wound.  Neurological: Negative for dizziness and numbness.  All other systems reviewed and are negative.     Allergies  Review of patient's allergies indicates no known allergies.  Home Medications   Prior to Admission medications   Medication Sig Start Date End Date Taking? Authorizing Provider  acetaminophen (TYLENOL) 500 MG tablet Take 1,000 mg by mouth every 6 (six) hours as needed for moderate pain (tooth pain).     Historical Provider, MD  erythromycin ophthalmic  ointment Place a 1/2 inch ribbon of ointment into the lower eyelid. Patient not taking: Reported on 05/20/2014 04/21/14   Charlestine Night, PA-C  meloxicam (MOBIC) 7.5 MG tablet Take 1 tablet (7.5 mg total) by mouth daily. 06/13/14   Trixie Dredge, PA-C  methocarbamol (ROBAXIN) 500 MG tablet Take 1 tablet (500 mg total) by mouth every 6 (six) hours as needed for muscle spasms (or pain). 06/13/14   Trixie Dredge, PA-C  oxyCODONE-acetaminophen (PERCOCET/ROXICET) 5-325 MG per tablet Take 1 tablet by mouth every 6 (six) hours as needed for severe pain. 06/16/14   Earley Favor, NP  predniSONE (DELTASONE) 20 MG tablet 3 tabs po daily x 3 days, then 2 tabs x 3 days, then 1.5 tabs x 3 days, then 1 tab x 3 days, then 0.5 tabs x 3 days Patient not taking: Reported on 04/21/2014 09/28/13   Dahlia Client Muthersbaugh, PA-C  traMADol (ULTRAM) 50 MG tablet Take 1 tablet (50 mg total) by mouth every 6 (six) hours as needed. Patient not taking: Reported on 06/13/2014 12/23/13   Santiago Glad, PA-C  triamcinolone ointment (KENALOG) 0.5 % Apply 1 application topically 2 (two) times daily. Patient not taking: Reported on 04/21/2014 09/28/13   Dahlia Client Muthersbaugh, PA-C   BP 172/92 mmHg  Pulse 80  Temp(Src) 97.8 F (36.6 C) (Oral)  Resp 18  SpO2 100% Physical Exam  Constitutional: He is oriented to person, place, and time. He appears well-developed and well-nourished.  HENT:  Head: Normocephalic.  Eyes: Pupils are  equal, round, and reactive to light.  Neck: Normal range of motion.  Cardiovascular: Normal rate.   Pulmonary/Chest: Effort normal.  Musculoskeletal: He exhibits edema and tenderness.       Right hand: He exhibits decreased range of motion, tenderness, bony tenderness and swelling. He exhibits normal capillary refill and no deformity.       Hands: Neurological: He is alert and oriented to person, place, and time.  Skin: Skin is warm. No erythema.  Nursing note and vitals reviewed.   ED Course  Procedures  (including critical care time) Labs Review Labs Reviewed - No data to display  Imaging Review No results found.   EKG Interpretation None     I reviewed patient's x-ray.  There is no fracture, subluxation, I have placed his fingers in splint with position of comfort with a slight curvature a stem into place and applied a sling so the patient can have his hand elevated all times.  He's been instructed to follow-up with Dr. Reita ClicheWeinhold if he does not see significant improvement in his pain and swelling.  By Monday  MDM   Final diagnoses:  Hand contusion, right, subsequent encounter         Earley FavorGail Tiphany Fayson, NP 06/16/14 0303  April Palumbo, MD 06/16/14 [id number]

## 2014-06-16 NOTE — Discharge Instructions (Signed)
Contusion A contusion is a deep bruise. Contusions happen when an injury causes bleeding under the skin. Signs of bruising include pain, puffiness (swelling), and discolored skin. The contusion may turn blue, purple, or yellow. HOME CARE   Put ice on the injured area.  Put ice in a plastic bag.  Place a towel between your skin and the bag.  Leave the ice on for 15-20 minutes, 03-04 times a day.  Only take medicine as told by your doctor.  Rest the injured area.  If possible, raise (elevate) the injured area to lessen puffiness. GET HELP RIGHT AWAY IF:   You have more bruising or puffiness.  You have pain that is getting worse.  Your puffiness or pain is not helped by medicine. MAKE SURE YOU:   Understand these instructions.  Will watch your condition.  Will get help right away if you are not doing well or get worse. Document Released: 08/07/2007 Document Revised: 05/13/2011 Document Reviewed: 12/24/2010 [facility] Patient Information 2015 [place]. This information is not intended to replace advice given to you by your health care provider. Make sure you discuss any questions you have with your health care provider. Please leave the splint on your hand, wear the sling so that her hand is in the place of allegiance position if you do not see significant decrease in pain and swelling.  Please make an appointment with Dr. Mina MarbleWeingold for further evaluation

## 2014-11-21 ENCOUNTER — Emergency Department (HOSPITAL_BASED_OUTPATIENT_CLINIC_OR_DEPARTMENT_OTHER)
Admission: EM | Admit: 2014-11-21 | Discharge: 2014-11-21 | Disposition: A | Discharge status: Home / Self Care | Payer: Self-pay | Attending: Emergency Medicine | Admitting: Emergency Medicine

## 2014-11-21 ENCOUNTER — Encounter (HOSPITAL_BASED_OUTPATIENT_CLINIC_OR_DEPARTMENT_OTHER): Payer: Self-pay

## 2014-11-21 ENCOUNTER — Emergency Department (HOSPITAL_BASED_OUTPATIENT_CLINIC_OR_DEPARTMENT_OTHER): Payer: Self-pay

## 2014-11-21 DIAGNOSIS — Z8719 Personal history of other diseases of the digestive system: Secondary | ICD-10-CM | POA: Insufficient documentation

## 2014-11-21 DIAGNOSIS — Z720 Tobacco use: Secondary | ICD-10-CM | POA: Insufficient documentation

## 2014-11-21 DIAGNOSIS — Y92410 Unspecified street and highway as the place of occurrence of the external cause: Secondary | ICD-10-CM | POA: Insufficient documentation

## 2014-11-21 DIAGNOSIS — S79911A Unspecified injury of right hip, initial encounter: Secondary | ICD-10-CM | POA: Insufficient documentation

## 2014-11-21 DIAGNOSIS — S3991XA Unspecified injury of abdomen, initial encounter: Secondary | ICD-10-CM | POA: Insufficient documentation

## 2014-11-21 DIAGNOSIS — S6991XA Unspecified injury of right wrist, hand and finger(s), initial encounter: Secondary | ICD-10-CM | POA: Insufficient documentation

## 2014-11-21 DIAGNOSIS — S62202A Unspecified fracture of first metacarpal bone, left hand, initial encounter for closed fracture: Secondary | ICD-10-CM | POA: Insufficient documentation

## 2014-11-21 DIAGNOSIS — S20419A Abrasion of unspecified back wall of thorax, initial encounter: Secondary | ICD-10-CM

## 2014-11-21 DIAGNOSIS — R55 Syncope and collapse: Secondary | ICD-10-CM | POA: Insufficient documentation

## 2014-11-21 DIAGNOSIS — S4991XA Unspecified injury of right shoulder and upper arm, initial encounter: Secondary | ICD-10-CM | POA: Insufficient documentation

## 2014-11-21 DIAGNOSIS — Y998 Other external cause status: Secondary | ICD-10-CM | POA: Insufficient documentation

## 2014-11-21 DIAGNOSIS — S62309A Unspecified fracture of unspecified metacarpal bone, initial encounter for closed fracture: Secondary | ICD-10-CM

## 2014-11-21 DIAGNOSIS — Y9389 Activity, other specified: Secondary | ICD-10-CM | POA: Insufficient documentation

## 2014-11-21 HISTORY — DX: Abrasion of unspecified back wall of thorax, initial encounter: S20.419A

## 2014-11-21 HISTORY — DX: Unspecified fracture of unspecified metacarpal bone, initial encounter for closed fracture: S62.309A

## 2014-11-21 MED ORDER — FENTANYL CITRATE (PF) 100 MCG/2ML IJ SOLN
100.0000 ug | Freq: Once | INTRAMUSCULAR | Status: AC
  Administered 2014-11-21: 100 ug via INTRAMUSCULAR
  Filled 2014-11-21: qty 2

## 2014-11-21 MED ORDER — OXYCODONE-ACETAMINOPHEN 5-325 MG PO TABS: 1.0000 | ORAL_TABLET | Freq: Four times a day (QID) | ORAL | Status: AC | PRN

## 2014-11-21 MED ORDER — HYDROMORPHONE HCL 1 MG/ML IJ SOLN
1.0000 mg | Freq: Once | INTRAMUSCULAR | Status: AC
  Administered 2014-11-21: 1 mg via INTRAMUSCULAR
  Filled 2014-11-21: qty 1
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
## 2014-11-21 NOTE — ED Notes (Signed)
EMT at bedside to apply splint 

## 2014-11-21 NOTE — ED Notes (Signed)
D/c home with girlfriend to drive. Directed to pharmacy to pick up medications

## 2014-11-21 NOTE — ED Notes (Signed)
Returned from radiology. 

## 2014-11-21 NOTE — ED Notes (Signed)
Patient refused the velcro splint for the right arm. Patient states" If its not broke then I don't need it or want it." RN Joss made aware.

## 2014-11-21 NOTE — ED Notes (Signed)
Patient transported to CT and xray 

## 2014-11-29 ENCOUNTER — Encounter (HOSPITAL_BASED_OUTPATIENT_CLINIC_OR_DEPARTMENT_OTHER): Payer: Self-pay | Admitting: *Deleted

## 2014-11-29 ENCOUNTER — Other Ambulatory Visit: Payer: Self-pay | Admitting: Orthopedic Surgery

## 2014-12-02 ENCOUNTER — Encounter (HOSPITAL_BASED_OUTPATIENT_CLINIC_OR_DEPARTMENT_OTHER): Payer: Self-pay | Admitting: *Deleted

## 2014-12-02 ENCOUNTER — Ambulatory Visit (HOSPITAL_BASED_OUTPATIENT_CLINIC_OR_DEPARTMENT_OTHER): Payer: No Typology Code available for payment source | Admitting: Anesthesiology

## 2014-12-02 ENCOUNTER — Ambulatory Visit (HOSPITAL_BASED_OUTPATIENT_CLINIC_OR_DEPARTMENT_OTHER): Payer: Self-pay | Admitting: Anesthesiology

## 2014-12-02 ENCOUNTER — Encounter (HOSPITAL_BASED_OUTPATIENT_CLINIC_OR_DEPARTMENT_OTHER)
Admission: RE | Disposition: A | Discharge status: Home / Self Care | Payer: Self-pay | Source: Ambulatory Visit | Attending: Orthopedic Surgery

## 2014-12-02 ENCOUNTER — Ambulatory Visit (HOSPITAL_BASED_OUTPATIENT_CLINIC_OR_DEPARTMENT_OTHER)
Admission: RE | Admit: 2014-12-02 | Discharge: 2014-12-02 | Disposition: A | Discharge status: Home / Self Care | Payer: Self-pay | Source: Ambulatory Visit | Attending: Orthopedic Surgery | Admitting: Orthopedic Surgery

## 2014-12-02 DIAGNOSIS — W19XXXA Unspecified fall, initial encounter: Secondary | ICD-10-CM | POA: Insufficient documentation

## 2014-12-02 DIAGNOSIS — F17210 Nicotine dependence, cigarettes, uncomplicated: Secondary | ICD-10-CM | POA: Insufficient documentation

## 2014-12-02 DIAGNOSIS — S62222A Displaced Rolando's fracture, left hand, initial encounter for closed fracture: Secondary | ICD-10-CM | POA: Insufficient documentation

## 2014-12-02 HISTORY — DX: Abrasion of unspecified back wall of thorax, initial encounter: S20.419A

## 2014-12-02 HISTORY — DX: Personal history of Methicillin resistant Staphylococcus aureus infection: Z86.14

## 2014-12-02 HISTORY — PX: OPEN REDUCTION INTERNAL FIXATION (ORIF) METACARPAL: SHX6234

## 2014-12-02 HISTORY — DX: Unspecified fracture of unspecified metacarpal bone, initial encounter for closed fracture: S62.309A

## 2014-12-02 SURGERY — OPEN REDUCTION INTERNAL FIXATION (ORIF) METACARPAL
Anesthesia: General | Site: Thumb | Laterality: Left

## 2014-12-02 MED ORDER — ONDANSETRON HCL 4 MG/2ML IJ SOLN
INTRAMUSCULAR | Status: DC | PRN
  Administered 2014-12-02: 4 mg via INTRAVENOUS

## 2014-12-02 MED ORDER — HYDROMORPHONE HCL 1 MG/ML IJ SOLN
0.2500 mg | INTRAMUSCULAR | Status: DC | PRN
  Administered 2014-12-02 (×4): 0.5 mg via INTRAVENOUS

## 2014-12-02 MED ORDER — OXYCODONE HCL 5 MG PO TABS
ORAL_TABLET | ORAL | Status: AC
  Filled 2014-12-02: qty 1

## 2014-12-02 MED ORDER — GLYCOPYRROLATE 0.2 MG/ML IJ SOLN: 0.2000 mg | Freq: Once | INTRAMUSCULAR | Status: DC | PRN

## 2014-12-02 MED ORDER — FENTANYL CITRATE (PF) 100 MCG/2ML IJ SOLN
INTRAMUSCULAR | Status: DC | PRN
  Administered 2014-12-02: 100 ug via INTRAVENOUS

## 2014-12-02 MED ORDER — OXYCODONE HCL 5 MG PO TABS
5.0000 mg | ORAL_TABLET | Freq: Once | ORAL | Status: DC | PRN
  Administered 2014-12-02: 5 mg via ORAL

## 2014-12-02 MED ORDER — BUPIVACAINE HCL (PF) 0.25 % IJ SOLN
INTRAMUSCULAR | Status: AC
  Filled 2014-12-02: qty 30

## 2014-12-02 MED ORDER — MIDAZOLAM HCL 2 MG/2ML IJ SOLN: 0.5000 mg | Freq: Once | INTRAMUSCULAR | Status: DC | PRN

## 2014-12-02 MED ORDER — MEPERIDINE HCL 25 MG/ML IJ SOLN: 6.2500 mg | INTRAMUSCULAR | Status: DC | PRN

## 2014-12-02 MED ORDER — MIDAZOLAM HCL 2 MG/2ML IJ SOLN
1.0000 mg | INTRAMUSCULAR | Status: DC | PRN
  Administered 2014-12-02: 2 mg via INTRAVENOUS

## 2014-12-02 MED ORDER — FENTANYL CITRATE (PF) 100 MCG/2ML IJ SOLN
INTRAMUSCULAR | Status: AC
  Filled 2014-12-02: qty 2

## 2014-12-02 MED ORDER — CEFAZOLIN SODIUM-DEXTROSE 2-3 GM-% IV SOLR
INTRAVENOUS | Status: AC
  Filled 2014-12-02: qty 50

## 2014-12-02 MED ORDER — FENTANYL CITRATE (PF) 100 MCG/2ML IJ SOLN
50.0000 ug | INTRAMUSCULAR | Status: AC | PRN
Start: 2014-12-02 — End: 2014-12-02
  Administered 2014-12-02: 50 ug via INTRAVENOUS
  Administered 2014-12-02 (×2): 25 ug via INTRAVENOUS

## 2014-12-02 MED ORDER — PROMETHAZINE HCL 25 MG/ML IJ SOLN: 6.2500 mg | INTRAMUSCULAR | Status: DC | PRN

## 2014-12-02 MED ORDER — MIDAZOLAM HCL 2 MG/2ML IJ SOLN
INTRAMUSCULAR | Status: AC
  Filled 2014-12-02: qty 4

## 2014-12-02 MED ORDER — DEXAMETHASONE SODIUM PHOSPHATE 4 MG/ML IJ SOLN
INTRAMUSCULAR | Status: DC | PRN
  Administered 2014-12-02: 10 mg via INTRAVENOUS

## 2014-12-02 MED ORDER — FENTANYL CITRATE (PF) 100 MCG/2ML IJ SOLN
INTRAMUSCULAR | Status: AC
  Filled 2014-12-02: qty 4

## 2014-12-02 MED ORDER — PROPOFOL 10 MG/ML IV BOLUS
INTRAVENOUS | Status: DC | PRN
  Administered 2014-12-02: 200 mg via INTRAVENOUS

## 2014-12-02 MED ORDER — CEFAZOLIN SODIUM-DEXTROSE 2-3 GM-% IV SOLR
2.0000 g | INTRAVENOUS | Status: AC
  Administered 2014-12-02: 2 g via INTRAVENOUS

## 2014-12-02 MED ORDER — OXYCODONE-ACETAMINOPHEN 5-325 MG PO TABS: 1.0000 | ORAL_TABLET | ORAL | Status: AC | PRN

## 2014-12-02 MED ORDER — HYDROMORPHONE HCL 1 MG/ML IJ SOLN
INTRAMUSCULAR | Status: AC
  Filled 2014-12-02: qty 1

## 2014-12-02 MED ORDER — BUPIVACAINE HCL (PF) 0.25 % IJ SOLN
INTRAMUSCULAR | Status: DC | PRN
  Administered 2014-12-02: 7 mL

## 2014-12-02 MED ORDER — LACTATED RINGERS IV SOLN
INTRAVENOUS | Status: DC
  Administered 2014-12-02: 11:00:00 via INTRAVENOUS

## 2014-12-02 MED ORDER — CHLORHEXIDINE GLUCONATE 4 % EX LIQD: 60.0000 mL | Freq: Once | CUTANEOUS | Status: DC

## 2014-12-02 MED ORDER — LIDOCAINE HCL (CARDIAC) 20 MG/ML IV SOLN
INTRAVENOUS | Status: DC | PRN
  Administered 2014-12-02: 80 mg via INTRAVENOUS

## 2014-12-02 SURGICAL SUPPLY — 73 items
APL SKNCLS STERI-STRIP NONHPOA (GAUZE/BANDAGES/DRESSINGS) ×1
BANDAGE ELASTIC 3 VELCRO ST LF (GAUZE/BANDAGES/DRESSINGS) ×3 IMPLANT
BANDAGE ELASTIC 4 VELCRO ST LF (GAUZE/BANDAGES/DRESSINGS) IMPLANT
BENZOIN TINCTURE PRP APPL 2/3 (GAUZE/BANDAGES/DRESSINGS) ×3 IMPLANT
BIT DRILL 1.1 MINI (BIT) ×1 IMPLANT
BLADE SURG 15 STRL LF DISP TIS (BLADE) ×1 IMPLANT
BLADE SURG 15 STRL SS (BLADE) ×3
BNDG CMPR 9X4 STRL LF SNTH (GAUZE/BANDAGES/DRESSINGS) ×1
BNDG CMPR MD 5X2 ELC HKLP STRL (GAUZE/BANDAGES/DRESSINGS) ×1
BNDG ELASTIC 2 VLCR STRL LF (GAUZE/BANDAGES/DRESSINGS) ×3 IMPLANT
BNDG ESMARK 4X9 LF (GAUZE/BANDAGES/DRESSINGS) ×3 IMPLANT
BNDG GAUZE ELAST 4 BULKY (GAUZE/BANDAGES/DRESSINGS) ×3 IMPLANT
CANISTER SUCT 1200ML W/VALVE (MISCELLANEOUS) IMPLANT
CLOSURE WOUND 1/2 X4 (GAUZE/BANDAGES/DRESSINGS) ×1
CORDS BIPOLAR (ELECTRODE) ×3 IMPLANT
COVER BACK TABLE 60X90IN (DRAPES) ×3 IMPLANT
CUFF TOURNIQUET SINGLE 18IN (TOURNIQUET CUFF) ×3 IMPLANT
DECANTER SPIKE VIAL GLASS SM (MISCELLANEOUS) IMPLANT
DRAPE EXTREMITY T 121X128X90 (DRAPE) ×3 IMPLANT
DRAPE OEC MINIVIEW 54X84 (DRAPES) ×3 IMPLANT
DRAPE SURG 17X23 STRL (DRAPES) ×3 IMPLANT
DRILL BIT 1.1 MINI (BIT) ×3
DURAPREP 26ML APPLICATOR (WOUND CARE) ×3 IMPLANT
GAUZE SPONGE 4X4 12PLY STRL (GAUZE/BANDAGES/DRESSINGS) ×3 IMPLANT
GAUZE SPONGE 4X4 16PLY XRAY LF (GAUZE/BANDAGES/DRESSINGS) IMPLANT
GAUZE XEROFORM 1X8 LF (GAUZE/BANDAGES/DRESSINGS) ×3 IMPLANT
GLOVE BIOGEL PI IND STRL 7.0 (GLOVE) ×3 IMPLANT
GLOVE BIOGEL PI INDICATOR 7.0 (GLOVE) ×6
GLOVE ECLIPSE 6.5 STRL STRAW (GLOVE) ×6 IMPLANT
GLOVE SURG SYN 8.0 (GLOVE) ×6 IMPLANT
GOWN STRL REUS W/ TWL LRG LVL3 (GOWN DISPOSABLE) ×2 IMPLANT
GOWN STRL REUS W/TWL LRG LVL3 (GOWN DISPOSABLE) ×6
GOWN STRL REUS W/TWL XL LVL3 (GOWN DISPOSABLE) ×6 IMPLANT
NEEDLE HYPO 25X1 1.5 SAFETY (NEEDLE) ×3 IMPLANT
NS IRRIG 1000ML POUR BTL (IV SOLUTION) ×3 IMPLANT
PACK BASIN DAY SURGERY FS (CUSTOM PROCEDURE TRAY) ×3 IMPLANT
PAD CAST 3X4 CTTN HI CHSV (CAST SUPPLIES) ×1 IMPLANT
PAD CAST 4YDX4 CTTN HI CHSV (CAST SUPPLIES) IMPLANT
PADDING CAST ABS 4INX4YD NS (CAST SUPPLIES) ×2
PADDING CAST ABS COTTON 4X4 ST (CAST SUPPLIES) ×1 IMPLANT
PADDING CAST COTTON 3X4 STRL (CAST SUPPLIES) ×3
PADDING CAST COTTON 4X4 STRL (CAST SUPPLIES)
PADDING UNDERCAST 2 STRL (CAST SUPPLIES) ×2
PADDING UNDERCAST 2X4 STRL (CAST SUPPLIES) ×1 IMPLANT
PLATE H EXTENDED LEFT 1.5MM (Plate) ×3 IMPLANT
SCREW CORTEX 1.5X10 (Screw) ×12 IMPLANT
SCREW CORTEX 1.5X11 (Screw) ×6 IMPLANT
SCREW CORTEX 1.5X16 (Screw) ×3 IMPLANT
SCREW CORTEX 1.5X18 (Screw) ×6 IMPLANT
SHEET MEDIUM DRAPE 40X70 STRL (DRAPES) ×3 IMPLANT
SPLINT PLASTER CAST XFAST 3X15 (CAST SUPPLIES) ×12 IMPLANT
SPLINT PLASTER CAST XFAST 4X15 (CAST SUPPLIES) IMPLANT
SPLINT PLASTER XTRA FAST SET 4 (CAST SUPPLIES)
SPLINT PLASTER XTRA FASTSET 3X (CAST SUPPLIES) ×24
STOCKINETTE 4X48 STRL (DRAPES) ×3 IMPLANT
STRIP CLOSURE SKIN 1/2X4 (GAUZE/BANDAGES/DRESSINGS) ×2 IMPLANT
SUCTION FRAZIER TIP 10 FR DISP (SUCTIONS) IMPLANT
SUT ETHILON 4 0 PS 2 18 (SUTURE) ×3 IMPLANT
SUT ETHILON 5 0 PS 2 18 (SUTURE) IMPLANT
SUT MERSILENE 4 0 P 3 (SUTURE) IMPLANT
SUT PROLENE 3 0 PS 2 (SUTURE) ×3 IMPLANT
SUT VIC AB 4-0 P-3 18XBRD (SUTURE) ×1 IMPLANT
SUT VIC AB 4-0 P3 18 (SUTURE) ×3
SUT VICRYL 4-0 PS2 18IN ABS (SUTURE) ×3 IMPLANT
SUT VICRYL RAPIDE 4-0 (SUTURE) IMPLANT
SUT VICRYL RAPIDE 4/0 PS 2 (SUTURE) IMPLANT
SYR BULB 3OZ (MISCELLANEOUS) IMPLANT
SYRINGE 10CC LL (SYRINGE) IMPLANT
TOWEL OR 17X24 6PK STRL BLUE (TOWEL DISPOSABLE) ×3 IMPLANT
TOWEL OR NON WOVEN STRL DISP B (DISPOSABLE) ×3 IMPLANT
TUBE CONNECTING 20'X1/4 (TUBING)
TUBE CONNECTING 20X1/4 (TUBING) IMPLANT
UNDERPAD 30X30 (UNDERPADS AND DIAPERS) ×3 IMPLANT

## 2014-12-02 NOTE — Discharge Instructions (Signed)

## 2014-12-02 NOTE — Op Note (Signed)
See note [id number]

## 2014-12-02 NOTE — Anesthesia Preprocedure Evaluation (Addendum)
Anesthesia Evaluation  Patient identified by MRN, date of birth, ID band Patient awake    Reviewed: Allergy & Precautions, NPO status , Patient's Chart, lab work & pertinent test results  History of Anesthesia Complications Negative for: history of anesthetic complications  Airway Mallampati: I  TM Distance: >3 FB Neck ROM: Full    Dental  (+) Dental Advisory Given   Pulmonary Current Smoker,    breath sounds clear to auscultation       Cardiovascular negative cardio ROS   Rhythm:Regular Rate:Normal     Neuro/Psych negative neurological ROS     GI/Hepatic negative GI ROS, Neg liver ROS,   Endo/Other  negative endocrine ROS  Renal/GU negative Renal ROS     Musculoskeletal   Abdominal   Peds  Hematology negative hematology ROS (+)   Anesthesia Other Findings   Reproductive/Obstetrics                            Anesthesia Physical Anesthesia Plan  ASA: II  Anesthesia Plan: General   Post-op Pain Management:    Induction: Intravenous  Airway Management Planned: LMA  Additional Equipment:   Intra-op Plan:   Post-operative Plan:   Informed Consent: I have reviewed the patients History and Physical, chart, labs and discussed the procedure including the risks, benefits and alternatives for the proposed anesthesia with the patient or authorized representative who has indicated his/her understanding and acceptance.   Dental advisory given  Plan Discussed with: CRNA and Surgeon  Anesthesia Plan Comments: (Plan routine monitors, GA- LMA OK Pt declines regional)        Anesthesia Quick Evaluation
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
## 2014-12-02 NOTE — Anesthesia Postprocedure Evaluation (Signed)
  Anesthesia Post-op Note  Patient: Phillip Moody  Procedure(s) Performed: Procedure(s): OPEN REDUCTION INTERNAL FIXATION (ORIF) LEFT THUMB METACARPAL (Left)  Patient Location: PACU  Anesthesia Type:General  Level of Consciousness: awake, alert , oriented and patient cooperative  Airway and Oxygen Therapy: Patient Spontanous Breathing  Post-op Pain: mild  Post-op Assessment: Post-op Vital signs reviewed, Patient's Cardiovascular Status Stable, Respiratory Function Stable, Patent Airway, No signs of Nausea or vomiting and Pain level controlled              Post-op Vital Signs: Reviewed and stable  Last Vitals:  Filed Vitals:   12/02/14 1500  BP: 152/94  Pulse: 64  Temp:   Resp: 15    Complications: No apparent anesthesia complications

## 2014-12-02 NOTE — Transfer of Care (Signed)
Immediate Anesthesia Transfer of Care Note  Patient: Phillip Moody  Procedure(s) Performed: Procedure(s): OPEN REDUCTION INTERNAL FIXATION (ORIF) LEFT THUMB METACARPAL (Left)  Patient Location: PACU  Anesthesia Type:General  Level of Consciousness: awake, sedated and patient cooperative  Airway & Oxygen Therapy: Patient Spontanous Breathing and Patient connected to face mask oxygen  Post-op Assessment: Report given to RN and Post -op Vital signs reviewed and stable  Post vital signs: Reviewed and stable  Last Vitals:  Filed Vitals:   12/02/14 1245  BP: 141/90  Pulse: 61  Temp:   Resp: 14    Complications: No apparent anesthesia complications

## 2014-12-02 NOTE — Op Note (Signed)
See note [id number]

## 2014-12-02 NOTE — Anesthesia Procedure Notes (Signed)
Procedure Name: LMA Insertion Date/Time: 12/02/2014 1:33 PM Performed by: Gar Gibbon Pre-anesthesia Checklist: Patient identified, Emergency Drugs available, Suction available and Patient being monitored Patient Re-evaluated:Patient Re-evaluated prior to inductionOxygen Delivery Method: Circle System Utilized Preoxygenation: Pre-oxygenation with 100% oxygen Intubation Type: IV induction Ventilation: Mask ventilation without difficulty LMA: LMA inserted LMA Size: 4.0 Number of attempts: 1 Airway Equipment and Method: Bite block Placement Confirmation: positive ETCO2 Tube secured with: Tape Dental Injury: Teeth and Oropharynx as per pre-operative assessment

## 2014-12-05 ENCOUNTER — Encounter (HOSPITAL_BASED_OUTPATIENT_CLINIC_OR_DEPARTMENT_OTHER): Payer: Self-pay | Admitting: Orthopedic Surgery

## 2014-12-05 NOTE — Op Note (Signed)
NAMEJAMARKIS, Phillip Moody              ACCOUNT NO.:  [id number]  MEDICAL RECORD NO.:  [id number]  LOCATION:                               FACILITY:  [facility]  PHYSICIAN:  Artist Pais. Weingold, M.D.DATE OF BIRTH:  1985-06-04  DATE OF PROCEDURE:  12/02/2014 DATE OF DISCHARGE:  12/02/2014                              OPERATIVE REPORT   PREOPERATIVE DIAGNOSIS:  Displaced intra-articular fracture, left thumb metacarpal.  POSTOPERATIVE DIAGNOSIS:  Displaced intra-articular fracture, left thumb metacarpal.  PROCEDURE:  Open reduction and internal fixation of above using a 1.5-mm modular handset ladder plate.  SURGEON:  Artist Pais. Mina Marble, M.D.  ASSISTANT:  Jonni Sanger, PA-C  ANESTHESIA:  General.  COMPLICATIONS:  No complications.  DRAINS:  No drains.  DESCRIPTION OF PROCEDURE:  The patient was taken to the operating suite after induction of adequate general anesthetic.  Left upper extremity was prepped and draped in usual sterile fashion.  An Esmarch was used to exsanguinate the limb.  Tourniquet was inflated to 250 mmHg.  At this point in time, a gentle C-shaped incision was made over the thumb metacarpal and radially-based flap was elevated.  Interval between the EPB and EPL was identified and split.  We dissected down to the metacarpal subperiosteally.  There was a Rolando-type fracture of the base of the thumb metacarpal, longitudinal traction, now, our pressure was used to reduce.  We then placed an 8-hole ladder plate dorsally with two screws proximally into the intra-articular fragments and then six screws into the main shaft distally.  Intraoperative fluoroscopy revealed adequate reduction on AP, lateral, and oblique view.  The wound was thoroughly irrigated.  Periosteum was closed with 2-0 Vicryl and the skin with a 3-0 Prolene subcuticular stitch.  Steri-Strips, 4x4s, fluffs, and a radial gutter splint was applied.  The patient tolerated the procedure well, went to  the recovery room in stable fashion.     Artist Pais Mina Marble, M.D.     MAW/MEDQ  D:  12/02/2014  T:  12/03/2014  Job:  [id number]

## 2014-12-07 ENCOUNTER — Ambulatory Visit: Payer: Self-pay | Attending: Orthopedic Surgery | Admitting: Occupational Therapy

## 2014-12-07 DIAGNOSIS — M79642 Pain in left hand: Secondary | ICD-10-CM | POA: Insufficient documentation

## 2014-12-07 DIAGNOSIS — M25642 Stiffness of left hand, not elsewhere classified: Secondary | ICD-10-CM | POA: Insufficient documentation
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
## 2014-12-07 NOTE — Patient Instructions (Signed)
WEARING SCHEDULE:  ?Wear splint at ALL times except for hygiene care  ? ?PURPOSE:  ?To prevent movement and for protection until injury can heal ? ?CARE OF SPLINT:  ?Keep splint away from heat sources including: stove, radiator or furnace, or a car in sunlight. The splint can melt and will no longer fit you properly ? ?Keep away from pets and children ? ?Clean the splint with rubbing alcohol 1-2 times per day.  ?* During this time, make sure you also clean your hand/arm as instructed by your therapist and/or perform dressing changes as needed. Then dry hand/arm completely before replacing splint. (When cleaning hand/arm, keep it immobilized in same position until splint is replaced) ? ?PRECAUTIONS/POTENTIAL PROBLEMS: ?*If you notice or experience increased pain, swelling, numbness, or a lingering reddened area from the splint: ?Contact your therapist immediately by calling [phone]. You must wear the splint for protection, but we will get you scheduled for adjustments as quickly as possible.  ?(If only straps or hooks need to be replaced and NO adjustments to the splint need to be made, just call the office ahead and let them know you are coming in) ? ?If you have any medical concerns or signs of infection, please call your doctor immediately ?  ?

## 2015-01-16 ENCOUNTER — Emergency Department (HOSPITAL_BASED_OUTPATIENT_CLINIC_OR_DEPARTMENT_OTHER)
Admission: EM | Admit: 2015-01-16 | Discharge: 2015-01-16 | Disposition: A | Discharge status: Home / Self Care | Payer: Self-pay | Attending: Emergency Medicine | Admitting: Emergency Medicine

## 2015-01-16 ENCOUNTER — Encounter (HOSPITAL_BASED_OUTPATIENT_CLINIC_OR_DEPARTMENT_OTHER): Payer: Self-pay | Admitting: Emergency Medicine

## 2015-01-16 ENCOUNTER — Emergency Department (HOSPITAL_BASED_OUTPATIENT_CLINIC_OR_DEPARTMENT_OTHER): Payer: Self-pay

## 2015-01-16 DIAGNOSIS — F17210 Nicotine dependence, cigarettes, uncomplicated: Secondary | ICD-10-CM | POA: Insufficient documentation

## 2015-01-16 DIAGNOSIS — Y9389 Activity, other specified: Secondary | ICD-10-CM | POA: Insufficient documentation

## 2015-01-16 DIAGNOSIS — X58XXXA Exposure to other specified factors, initial encounter: Secondary | ICD-10-CM | POA: Insufficient documentation

## 2015-01-16 DIAGNOSIS — Y990 Civilian activity done for income or pay: Secondary | ICD-10-CM | POA: Insufficient documentation

## 2015-01-16 DIAGNOSIS — S29001A Unspecified injury of muscle and tendon of front wall of thorax, initial encounter: Secondary | ICD-10-CM | POA: Insufficient documentation

## 2015-01-16 DIAGNOSIS — R0781 Pleurodynia: Secondary | ICD-10-CM

## 2015-01-16 DIAGNOSIS — Z8781 Personal history of (healed) traumatic fracture: Secondary | ICD-10-CM | POA: Insufficient documentation

## 2015-01-16 DIAGNOSIS — Y9289 Other specified places as the place of occurrence of the external cause: Secondary | ICD-10-CM | POA: Insufficient documentation

## 2015-01-16 DIAGNOSIS — Z8614 Personal history of Methicillin resistant Staphylococcus aureus infection: Secondary | ICD-10-CM | POA: Insufficient documentation

## 2015-01-16 MED ORDER — NAPROXEN 250 MG PO TABS: 250.0000 mg | ORAL_TABLET | Freq: Two times a day (BID) | ORAL | Status: AC

## 2015-01-16 MED ORDER — METHOCARBAMOL 500 MG PO TABS: 500.0000 mg | ORAL_TABLET | Freq: Two times a day (BID) | ORAL | Status: AC | PRN

## 2015-01-16 MED ORDER — ACETAMINOPHEN 325 MG PO TABS
650.0000 mg | ORAL_TABLET | Freq: Once | ORAL | Status: AC
Start: 2015-01-16 — End: 2015-01-16
  Administered 2015-01-16: 650 mg via ORAL
  Filled 2015-01-16: qty 2

## 2015-01-16 MED ORDER — METHOCARBAMOL 500 MG PO TABS
500.0000 mg | ORAL_TABLET | Freq: Once | ORAL | Status: AC
  Administered 2015-01-16: 500 mg via ORAL
  Filled 2015-01-16: qty 1
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
## 2015-01-16 NOTE — ED Notes (Signed)
PA at bedside.

## 2015-01-16 NOTE — ED Notes (Signed)
Patient states that he has pain to his left rib area post cough earlier this am.

## 2015-01-16 NOTE — Discharge Instructions (Signed)

## 2015-10-14 ENCOUNTER — Encounter (HOSPITAL_COMMUNITY): Payer: Self-pay

## 2015-10-14 ENCOUNTER — Emergency Department (HOSPITAL_COMMUNITY)
Admission: EM | Admit: 2015-10-14 | Discharge: 2015-10-14 | Disposition: A | Discharge status: Home / Self Care | Payer: Self-pay | Attending: Emergency Medicine | Admitting: Emergency Medicine

## 2015-10-14 DIAGNOSIS — Z79899 Other long term (current) drug therapy: Secondary | ICD-10-CM | POA: Insufficient documentation

## 2015-10-14 DIAGNOSIS — T401X1A Poisoning by heroin, accidental (unintentional), initial encounter: Secondary | ICD-10-CM | POA: Insufficient documentation

## 2015-10-14 DIAGNOSIS — F17210 Nicotine dependence, cigarettes, uncomplicated: Secondary | ICD-10-CM | POA: Insufficient documentation
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
## 2015-10-14 NOTE — ED Notes (Signed)
Bed: ZO10WA15 Expected date:  Expected time:  Means of arrival:  Comments: 30 yo heroin OD
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
## 2015-10-14 NOTE — ED Notes (Signed)
He remains in no distress and has eaten a sandwich and drank Sprite per his request.
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
